# Patient Record
Sex: Male | Born: 1955 | Race: White | Hispanic: No | Marital: Married | Smoking: Current every day smoker
Health system: Southern US, Community
[De-identification: ages and names within clinical notes are randomized; demographics above are authoritative.]

## PROBLEM LIST (undated history)

## (undated) DIAGNOSIS — I10 Essential (primary) hypertension: Secondary | ICD-10-CM

## (undated) DIAGNOSIS — E119 Type 2 diabetes mellitus without complications: Secondary | ICD-10-CM

---

## 2016-06-09 ENCOUNTER — Encounter: Payer: Self-pay | Admitting: Emergency Medicine

## 2016-06-09 ENCOUNTER — Inpatient Hospital Stay
Admission: EM | Admit: 2016-06-09 | Discharge: 2016-06-11 | DRG: 247 | Disposition: A | Discharge status: Home / Self Care | Payer: BLUE CROSS/BLUE SHIELD | Attending: Internal Medicine | Admitting: Internal Medicine

## 2016-06-09 ENCOUNTER — Emergency Department: Payer: BLUE CROSS/BLUE SHIELD

## 2016-06-09 DIAGNOSIS — R079 Chest pain, unspecified: Secondary | ICD-10-CM | POA: Diagnosis not present

## 2016-06-09 DIAGNOSIS — Z7984 Long term (current) use of oral hypoglycemic drugs: Secondary | ICD-10-CM

## 2016-06-09 DIAGNOSIS — Z79899 Other long term (current) drug therapy: Secondary | ICD-10-CM | POA: Diagnosis not present

## 2016-06-09 DIAGNOSIS — J449 Chronic obstructive pulmonary disease, unspecified: Secondary | ICD-10-CM | POA: Diagnosis present

## 2016-06-09 DIAGNOSIS — E669 Obesity, unspecified: Secondary | ICD-10-CM | POA: Diagnosis present

## 2016-06-09 DIAGNOSIS — E119 Type 2 diabetes mellitus without complications: Secondary | ICD-10-CM | POA: Diagnosis present

## 2016-06-09 DIAGNOSIS — I10 Essential (primary) hypertension: Secondary | ICD-10-CM | POA: Diagnosis present

## 2016-06-09 DIAGNOSIS — I255 Ischemic cardiomyopathy: Secondary | ICD-10-CM | POA: Diagnosis present

## 2016-06-09 DIAGNOSIS — G4733 Obstructive sleep apnea (adult) (pediatric): Secondary | ICD-10-CM

## 2016-06-09 DIAGNOSIS — Z6831 Body mass index (BMI) 31.0-31.9, adult: Secondary | ICD-10-CM

## 2016-06-09 DIAGNOSIS — N179 Acute kidney failure, unspecified: Secondary | ICD-10-CM | POA: Diagnosis present

## 2016-06-09 DIAGNOSIS — F17200 Nicotine dependence, unspecified, uncomplicated: Secondary | ICD-10-CM | POA: Diagnosis present

## 2016-06-09 DIAGNOSIS — I2510 Atherosclerotic heart disease of native coronary artery without angina pectoris: Secondary | ICD-10-CM | POA: Diagnosis present

## 2016-06-09 DIAGNOSIS — I214 Non-ST elevation (NSTEMI) myocardial infarction: Principal | ICD-10-CM | POA: Diagnosis present

## 2016-06-09 DIAGNOSIS — E785 Hyperlipidemia, unspecified: Secondary | ICD-10-CM

## 2016-06-09 DIAGNOSIS — N289 Disorder of kidney and ureter, unspecified: Secondary | ICD-10-CM

## 2016-06-09 DIAGNOSIS — I447 Left bundle-branch block, unspecified: Secondary | ICD-10-CM

## 2016-06-09 DIAGNOSIS — E782 Mixed hyperlipidemia: Secondary | ICD-10-CM | POA: Diagnosis present

## 2016-06-09 DIAGNOSIS — Z716 Tobacco abuse counseling: Secondary | ICD-10-CM

## 2016-06-09 HISTORY — DX: Type 2 diabetes mellitus without complications: E11.9

## 2016-06-09 HISTORY — DX: Essential (primary) hypertension: I10

## 2016-06-09 LAB — CBC WITH DIFFERENTIAL/PLATELET
BASOS ABS: 0.1 10*3/uL (ref 0–0.1)
BASOS PCT: 1 %
EOS PCT: 2 %
Eosinophils Absolute: 0.1 10*3/uL (ref 0–0.7)
HEMATOCRIT: 45.8 % (ref 40.0–52.0)
Hemoglobin: 15.9 g/dL (ref 13.0–18.0)
LYMPHS PCT: 21 %
Lymphs Abs: 2.1 10*3/uL (ref 1.0–3.6)
MCH: 28.8 pg (ref 26.0–34.0)
MCHC: 34.7 g/dL (ref 32.0–36.0)
MCV: 83 fL (ref 80.0–100.0)
MONO ABS: 0.7 10*3/uL (ref 0.2–1.0)
Monocytes Relative: 8 %
NEUTROS ABS: 6.6 10*3/uL — AB (ref 1.4–6.5)
Neutrophils Relative %: 68 %
PLATELETS: 245 10*3/uL (ref 150–440)
RBC: 5.52 MIL/uL (ref 4.40–5.90)
RDW: 13.5 % (ref 11.5–14.5)
WBC: 9.6 10*3/uL (ref 3.8–10.6)

## 2016-06-09 LAB — COMPREHENSIVE METABOLIC PANEL
ALBUMIN: 3.9 g/dL (ref 3.5–5.0)
ALT: 18 U/L (ref 17–63)
AST: 24 U/L (ref 15–41)
Alkaline Phosphatase: 54 U/L (ref 38–126)
Anion gap: 9 (ref 5–15)
BUN: 23 mg/dL — AB (ref 6–20)
CHLORIDE: 101 mmol/L (ref 101–111)
CO2: 26 mmol/L (ref 22–32)
Calcium: 9.7 mg/dL (ref 8.9–10.3)
Creatinine, Ser: 1.26 mg/dL — ABNORMAL HIGH (ref 0.61–1.24)
GFR calc Af Amer: >60 mL/min (ref >60)
GLUCOSE: 159 mg/dL — AB (ref 65–99)
POTASSIUM: 4.2 mmol/L (ref 3.5–5.1)
Sodium: 136 mmol/L (ref 135–145)
Total Bilirubin: 0.5 mg/dL (ref 0.3–1.2)
Total Protein: 7.3 g/dL (ref 6.5–8.1)

## 2016-06-09 LAB — GLUCOSE, CAPILLARY: GLUCOSE-CAPILLARY: 141 mg/dL — AB (ref 65–99)

## 2016-06-09 LAB — TROPONIN I
TROPONIN I: 0.22 ng/mL — AB (ref <0.03)
TROPONIN I: 0.42 ng/mL — AB (ref <0.03)
TROPONIN I: 1.46 ng/mL — AB (ref <0.03)
Troponin I: 0.35 ng/mL (ref <0.03)

## 2016-06-09 LAB — APTT: APTT: 73 s — AB (ref 24–36)

## 2016-06-09 LAB — PROTIME-INR
INR: 1.06
Prothrombin Time: 13.8 seconds (ref 11.4–15.2)

## 2016-06-09 MED ORDER — GLYBURIDE 2.5 MG PO TABS
2.5000 mg | ORAL_TABLET | Freq: Every day | ORAL | Status: DC
  Filled 2016-06-09: qty 1

## 2016-06-09 MED ORDER — METFORMIN HCL 500 MG PO TABS: 1000.0000 mg | ORAL_TABLET | Freq: Every day | ORAL | Status: DC

## 2016-06-09 MED ORDER — HEPARIN BOLUS VIA INFUSION
4000.0000 [IU] | Freq: Once | INTRAVENOUS | Status: AC
  Administered 2016-06-09: 4000 [IU] via INTRAVENOUS
  Filled 2016-06-09: qty 4000

## 2016-06-09 MED ORDER — HYDROCODONE-ACETAMINOPHEN 5-325 MG PO TABS
1.0000 | ORAL_TABLET | Freq: Once | ORAL | Status: AC
  Administered 2016-06-09: 1 via ORAL
  Filled 2016-06-09: qty 1

## 2016-06-09 MED ORDER — SODIUM CHLORIDE 0.9% FLUSH
3.0000 mL | Freq: Two times a day (BID) | INTRAVENOUS | Status: DC
  Administered 2016-06-10: 3 mL via INTRAVENOUS

## 2016-06-09 MED ORDER — FENOFIBRATE 160 MG PO TABS: 160.0000 mg | ORAL_TABLET | Freq: Every day | ORAL | Status: DC

## 2016-06-09 MED ORDER — IPRATROPIUM BROMIDE 0.02 % IN SOLN: 0.5000 mg | Freq: Four times a day (QID) | RESPIRATORY_TRACT | Status: DC | PRN

## 2016-06-09 MED ORDER — NITROGLYCERIN 2 % TD OINT
0.5000 [in_us] | TOPICAL_OINTMENT | Freq: Four times a day (QID) | TRANSDERMAL | Status: DC
  Administered 2016-06-10 (×2): 0.5 [in_us] via TOPICAL
  Filled 2016-06-09 (×3): qty 1

## 2016-06-09 MED ORDER — METOPROLOL TARTRATE 25 MG PO TABS
25.0000 mg | ORAL_TABLET | Freq: Four times a day (QID) | ORAL | Status: DC
  Administered 2016-06-09 – 2016-06-10 (×2): 25 mg via ORAL
  Filled 2016-06-09 (×2): qty 1

## 2016-06-09 MED ORDER — SODIUM CHLORIDE 0.9% FLUSH
3.0000 mL | Freq: Two times a day (BID) | INTRAVENOUS | Status: DC
  Administered 2016-06-10 – 2016-06-11 (×2): 3 mL via INTRAVENOUS

## 2016-06-09 MED ORDER — NITROGLYCERIN 2 % TD OINT
1.0000 [in_us] | TOPICAL_OINTMENT | Freq: Once | TRANSDERMAL | Status: AC
  Administered 2016-06-09: 1 [in_us] via TOPICAL
  Filled 2016-06-09: qty 1

## 2016-06-09 MED ORDER — ALBUTEROL SULFATE (2.5 MG/3ML) 0.083% IN NEBU: 2.5000 mg | INHALATION_SOLUTION | Freq: Four times a day (QID) | RESPIRATORY_TRACT | Status: DC | PRN

## 2016-06-09 MED ORDER — ASPIRIN EC 81 MG PO TBEC
81.0000 mg | DELAYED_RELEASE_TABLET | Freq: Every day | ORAL | Status: DC
  Administered 2016-06-10: 81 mg via ORAL
  Filled 2016-06-09: qty 1

## 2016-06-09 MED ORDER — MORPHINE SULFATE (PF) 2 MG/ML IV SOLN: 2.0000 mg | INTRAVENOUS | Status: DC | PRN

## 2016-06-09 MED ORDER — NICOTINE 21 MG/24HR TD PT24
21.0000 mg | MEDICATED_PATCH | Freq: Every day | TRANSDERMAL | Status: DC
  Administered 2016-06-10 – 2016-06-11 (×2): 21 mg via TRANSDERMAL
  Filled 2016-06-09 (×2): qty 1

## 2016-06-09 MED ORDER — INSULIN ASPART 100 UNIT/ML ~~LOC~~ SOLN
6.0000 [IU] | Freq: Three times a day (TID) | SUBCUTANEOUS | Status: DC
  Administered 2016-06-10 – 2016-06-11 (×5): 6 [IU] via SUBCUTANEOUS
  Filled 2016-06-09 (×5): qty 6

## 2016-06-09 MED ORDER — ACETAMINOPHEN 325 MG PO TABS: 650.0000 mg | ORAL_TABLET | Freq: Four times a day (QID) | ORAL | Status: DC | PRN

## 2016-06-09 MED ORDER — TIOTROPIUM BROMIDE MONOHYDRATE 18 MCG IN CAPS
18.0000 ug | ORAL_CAPSULE | Freq: Every day | RESPIRATORY_TRACT | Status: DC
  Administered 2016-06-10 – 2016-06-11 (×2): 18 ug via RESPIRATORY_TRACT
  Filled 2016-06-09: qty 5

## 2016-06-09 MED ORDER — ONDANSETRON HCL 4 MG/2ML IJ SOLN: 4.0000 mg | Freq: Four times a day (QID) | INTRAMUSCULAR | Status: DC | PRN

## 2016-06-09 MED ORDER — HEPARIN (PORCINE) IN NACL 100-0.45 UNIT/ML-% IJ SOLN
1400.0000 [IU]/h | INTRAMUSCULAR | Status: DC
  Administered 2016-06-09: 1200 [IU]/h via INTRAVENOUS
  Filled 2016-06-09 (×2): qty 250

## 2016-06-09 MED ORDER — ONDANSETRON HCL 4 MG PO TABS: 4.0000 mg | ORAL_TABLET | Freq: Four times a day (QID) | ORAL | Status: DC | PRN

## 2016-06-09 MED ORDER — SODIUM CHLORIDE 0.9% FLUSH: 3.0000 mL | INTRAVENOUS | Status: DC | PRN

## 2016-06-09 MED ORDER — IPRATROPIUM BROMIDE 0.02 % IN SOLN
0.5000 mg | RESPIRATORY_TRACT | Status: DC
  Administered 2016-06-09: 0.5 mg via RESPIRATORY_TRACT
  Filled 2016-06-09: qty 2.5

## 2016-06-09 MED ORDER — INSULIN ASPART 100 UNIT/ML ~~LOC~~ SOLN
0.0000 [IU] | Freq: Three times a day (TID) | SUBCUTANEOUS | Status: DC
  Administered 2016-06-10: 4 [IU] via SUBCUTANEOUS
  Filled 2016-06-09: qty 4

## 2016-06-09 MED ORDER — ACETAMINOPHEN 650 MG RE SUPP: 650.0000 mg | Freq: Four times a day (QID) | RECTAL | Status: DC | PRN

## 2016-06-09 MED ORDER — LISINOPRIL 20 MG PO TABS
40.0000 mg | ORAL_TABLET | Freq: Every day | ORAL | Status: DC
  Administered 2016-06-10 – 2016-06-11 (×2): 40 mg via ORAL
  Filled 2016-06-09 (×2): qty 2

## 2016-06-09 MED ORDER — GUAIFENESIN ER 600 MG PO TB12
600.0000 mg | ORAL_TABLET | Freq: Two times a day (BID) | ORAL | Status: DC
  Filled 2016-06-09: qty 1

## 2016-06-09 MED ORDER — HYDROCODONE-ACETAMINOPHEN 5-325 MG PO TABS: 1.0000 | ORAL_TABLET | ORAL | Status: DC | PRN

## 2016-06-09 MED ORDER — SODIUM CHLORIDE 0.9 % IV SOLN: 250.0000 mL | INTRAVENOUS | Status: DC | PRN

## 2016-06-09 MED ORDER — ALBUTEROL SULFATE (2.5 MG/3ML) 0.083% IN NEBU
2.5000 mg | INHALATION_SOLUTION | Freq: Four times a day (QID) | RESPIRATORY_TRACT | Status: DC
  Administered 2016-06-09: 2.5 mg via RESPIRATORY_TRACT
  Filled 2016-06-09: qty 3

## 2016-06-09 MED ORDER — DOCUSATE SODIUM 100 MG PO CAPS
100.0000 mg | ORAL_CAPSULE | Freq: Two times a day (BID) | ORAL | Status: DC
  Administered 2016-06-11: 100 mg via ORAL
  Filled 2016-06-09: qty 1

## 2016-06-09 NOTE — ED Triage Notes (Signed)
Arrived via EMS, a&o.  Onset of chest pain 3 hours ago.  Given nitro x1 and  Asa x4.  Rate pain 3/10 now. Previously 6/10. Skin w/d with good color.

## 2016-06-09 NOTE — ED Notes (Signed)
X-ray at bedside

## 2016-06-09 NOTE — ED Provider Notes (Addendum)
Time Seen: Approximately 1541  I have reviewed the triage notes  Chief Complaint: Chest Pain   History of Present Illness: Alec Baker is a 61 y.o. male who had a silent no significant history of known cardiovascular disease. 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 He noticed some substernal chest discomfort which radiates to the middle of the back that started around noon. He states his symptoms continued to 2 PM. He had some associated diaphoresis with no nausea or vomiting or shortness of breath. EMS was notified and the patient received nitroglycerin and aspirin he has not really noticed any significant improvement other than development of a headache with the nitroglycerin. He denies any arm or jaw pain. He states he went and saw his primary physician last week and was told that he had an electrical abnormality on his EKG and she wanted to set him up with a cardiologist.   Past Medical History:  Diagnosis Date  . Diabetes mellitus without complication (HCC)   . Hypertension     There are no active problems to display for this patient.   No past surgical history on file.  No past surgical history on file.    Allergies:  Patient has no known allergies. No known drug allergies Family History: No family history on file. Patient denies any early cardiovascular disease or cerebral vascular accident and his family history Social History: Social History  Substance Use Topics  . Smoking status: Current Every Day Smoker  . Smokeless tobacco: Never Used  . Alcohol use Not on file     Review of Systems:   10 point review of systems was performed and was otherwise negative:  Constitutional: No fever Eyes: No visual disturbances ENT: No sore throat, ear pain Cardiac: Patient describes current 3 out of 10 substernal chest pressure. He states it radiates to the middle of the back. And he states his back discomfort now is somewhat worse  than the chest discomfort. It became pleuritic or positional component Respiratory: No shortness of breath, wheezing, or stridor Abdomen: No abdominal pain, no vomiting, No diarrhea Endocrine: No weight loss, No night sweats Extremities: No peripheral edema, cyanosis Skin: No rashes, easy bruising Neurologic: No focal weakness, trouble with speech or swollowing Urologic: No dysuria, Hematuria, or urinary frequency Outside of smoking and driving his truck he denies any pulmonary emboli risk factors  Physical Exam:  ED Triage Vitals  Enc Vitals Group     BP 06/09/16 1525 101/70     Pulse Rate 06/09/16 1525 74     Resp 06/09/16 1525 18     Temp 06/09/16 1525 97.9 F (36.6 C)     Temp Source 06/09/16 1525 Oral     SpO2 06/09/16 1525 93 %     Weight 06/09/16 1527 210 lb (95.3 kg)     Height 06/09/16 1527 5\' 8"  (1.727 m)     Head Circumference --      Peak Flow --      Pain Score 06/09/16 1528 3     Pain Loc --      Pain Edu? --      Excl. in GC? --     General: Awake , Alert , and Oriented times 3; GCS 15 Head: Normal cephalic , atraumatic Eyes: Pupils equal , round, reactive to light Nose/Throat: No nasal drainage, patent upper airway without erythema or exudate.  Neck: Supple, Full range of motion, No anterior adenopathy or palpable  thyroid masses Lungs: Clear to ascultation without wheezes , rhonchi, or rales Heart: Regular rate, regular rhythm without murmurs , gallops , or rubs Abdomen: Soft, non tender without rebound, guarding , or rigidity; bowel sounds positive and symmetric in all 4 quadrants. No organomegaly .        Extremities: 2 plus symmetric pulses. No edema, clubbing or cyanosis Neurologic: normal ambulation, Motor symmetric without deficits, sensory intact Skin: warm, dry, no rashes   Labs:   All laboratory work was reviewed including any pertinent negatives or positives listed below:  Labs Reviewed  CBC WITH DIFFERENTIAL/PLATELET - Abnormal; Notable for  the following:       Result Value   Neutro Abs 6.6 (*)    All other components within normal limits  COMPREHENSIVE METABOLIC PANEL  TROPONIN I    EKG:  ED ECG REPORT I, Jennye MoccasinBrian S Lanecia Sliva, the attending physician, personally viewed and interpreted this ECG.  Date: 06/09/2016 EKG Time: 1523 Rate: 78 Rhythm: normal sinus rhythm QRS Axis: normal Intervals: Left bundle branch block ST/T Wave abnormalities: normal Conduction Disturbances: none Narrative Interpretation: unremarkable No past EKGs for comparison No associated ischemic changes with a left bundle branch block  Radiology:   "Dg Chest 2 View  Result Date: 06/09/2016 CLINICAL DATA:  Onset of chest pain 3 hours prior to admission to the emergency department. Pain is decreased following nitroglycerin. EXAM: CHEST  2 VIEW COMPARISON:  None in PACs FINDINGS: The lungs are adequately inflated. There is no focal infiltrate. The heart and pulmonary vascularity are normal. The mediastinum is normal in width. There is calcification in the wall of the aortic arch. The bony thorax is unremarkable. IMPRESSION: There is no acute cardiopulmonary abnormality. Electronically Signed   By: David  SwazilandJordan M.D.   On: 06/09/2016 16:32  "   I personally reviewed the radiologic studies   ED Course:  Differential includes all life-threatening causes for chest pain. This includes but is not exclusive to acute coronary syndrome, aortic dissection, pulmonary embolism, cardiac tamponade, community-acquired pneumonia, pericarditis, musculoskeletal chest wall pain, etc. The patient's troponin comes back at 0.22 and he has a left bundle branch block pattern on his EKG but does not have any Sgarbossa criteria. We are attempting to acquire the EKG that was performed as primary physician's office for comparison. Patient's are received aspirin and nitroglycerin therapy and is having symptomatic improvement. Patient will require hospitalization, cardiac  consultation, and possible invasive studies while here in the hospital.  Assessment:  Acute non-STEMI myocardial infarction   Final Clinical Impression:   Final diagnoses:  Chest pain     Plan:  Inpatient            Jennye MoccasinBrian S Calais Svehla, MD 06/09/16 1645    Jennye MoccasinBrian S Kasey Ewings, MD 06/09/16 [phone]

## 2016-06-09 NOTE — H&P (Addendum)
[facility] - [place] [facility]   PATIENT NAME: Alec HeinzRex Suliman    MR#:  [id number]  DATE OF BIRTH:  07-26-1955  DATE OF ADMISSION:  06/09/2016  PRIMARY CARE PHYSICIAN: No PCP Per Patient   REQUESTING/REFERRING PHYSICIAN:   CHIEF COMPLAINT:   Chief Complaint  Patient presents with  . Chest Pain    HISTORY OF PRESENT ILLNESS: Alec Baker  is a 61 y.o. male with a known history of Hypertension, hyperlipidemia, diabetes, who is troubled driver, who presents to the hospital with complaints of severe midsternal chest pain. Going to the patient, he was sitting in the truck and waiting to be unloaded, when he suddenly started having midsternal chest pain. Patient's pain was described as sudden onset, and other than by intensity, sharp "like a hammer hitting the chest" pain. There is no alleviating or aggravating factors. No radiation. There was no lightheadedness, dizziness or shortness of breath, patient has some nausea, but not vomiting, also chills. He presented to emergency room for further evaluation by EMS and was noted to have mild elevation of troponin. Hospitalist services were contacted for admission  PAST MEDICAL HISTORY:   Past Medical History:  Diagnosis Date  . Diabetes mellitus without complication (HCC)   . Hypertension     PAST SURGICAL HISTORY: No past surgical history on file.  SOCIAL HISTORY:  Social History  Substance Use Topics  . Smoking status: Current Every Day Smoker  . Smokeless tobacco: Never Used  . Alcohol use Not on file    FAMILY HISTORY: No family history on file.  DRUG ALLERGIES: No Known Allergies  Review of Systems  Constitutional: Positive for chills and malaise/fatigue.  Cardiovascular: Positive for chest pain.    MEDICATIONS AT HOME:  Prior to Admission medications   Medication Sig Start Date End Date Taking? Authorizing Provider  fenofibrate (TRICOR) 145 MG tablet Take 1 tablet by mouth daily. 06/04/16  Yes Historical  Provider, MD  glyBURIDE (DIABETA) 2.5 MG tablet Take 1 tablet by mouth daily. 06/04/16  Yes Historical Provider, MD  hydrochlorothiazide (HYDRODIURIL) 25 MG tablet Take 1 tablet by mouth daily. 06/04/16  Yes Historical Provider, MD  ibuprofen (ADVIL,MOTRIN) 800 MG tablet Take 1 tablet by mouth 4 (four) times daily as needed. 04/02/16  Yes Historical Provider, MD  lisinopril (PRINIVIL,ZESTRIL) 40 MG tablet Take 40 mg by mouth daily. 06/03/16  Yes Historical Provider, MD  metFORMIN (GLUCOPHAGE) 1000 MG tablet Take 1 tablet by mouth daily. 06/04/16  Yes Historical Provider, MD      PHYSICAL EXAMINATION:   VITAL SIGNS: Blood pressure 114/66, pulse 72, temperature 97.9 F (36.6 C), temperature source Oral, resp. rate 10, height 5\' 8"  (1.727 m), weight 95.3 kg (210 lb), SpO2 98 %.  GENERAL:  61 y.o.-year-old patient lying in the bed with no acute distress. Smiling and comfortable EYES: Pupils equal, round, reactive to light and accommodation. No scleral icterus. Extraocular muscles intact.  HEENT: Head atraumatic, normocephalic. Oropharynx and nasopharynx clear.  NECK:  Supple, no jugular venous distention. No thyroid enlargement, no tenderness.  LUNGS: Markedly diminished breath sounds bilaterally, anteriorly and posteriorly, scattered wheezing, ,rhonchi , but no rales or crepitations . Intermittent use of accessory muscles of respiration.  CARDIOVASCULAR: S1, S2 normal. No murmurs, rubs, or gallops.  ABDOMEN: Soft, nontender, nondistended. Bowel sounds present. No organomegaly or mass.  EXTREMITIES: No pedal edema, cyanosis, or clubbing. No calf tenderness bilaterally NEUROLOGIC: Cranial nerves II through XII are intact. Muscle strength 5/5 in all extremities.  Sensation intact. Gait not checked.  PSYCHIATRIC: The patient is alert and oriented x 3.  SKIN: No obvious rash, lesion, or ulcer.   LABORATORY PANEL:   CBC  Recent Labs Lab 06/09/16 1533  WBC 9.6  HGB 15.9  HCT 45.8  PLT 245  MCV  83.0  MCH 28.8  MCHC 34.7  RDW 13.5  LYMPHSABS 2.1  MONOABS 0.7  EOSABS 0.1  BASOSABS 0.1   ------------------------------------------------------------------------------------------------------------------  Chemistries   Recent Labs Lab 06/09/16 1533  NA 136  K 4.2  CL 101  CO2 26  GLUCOSE 159*  BUN 23*  CREATININE 1.26*  CALCIUM 9.7  AST 24  ALT 18  ALKPHOS 54  BILITOT 0.5   ------------------------------------------------------------------------------------------------------------------  Cardiac Enzymes  Recent Labs Lab 06/09/16 1533  TROPONINI 0.22*   ------------------------------------------------------------------------------------------------------------------  RADIOLOGY: Dg Chest 2 View  Result Date: 06/09/2016 CLINICAL DATA:  Onset of chest pain 3 hours prior to admission to the emergency department. Pain is decreased following nitroglycerin. EXAM: CHEST  2 VIEW COMPARISON:  None in PACs FINDINGS: The lungs are adequately inflated. There is no focal infiltrate. The heart and pulmonary vascularity are normal. The mediastinum is normal in width. There is calcification in the wall of the aortic arch. The bony thorax is unremarkable. IMPRESSION: There is no acute cardiopulmonary abnormality. Electronically Signed   By: David  [place] M.D.   On: 06/09/2016 16:32    EKG: Orders placed or performed during the hospital encounter of 06/09/16  . ED EKG  . ED EKG  EKG in emergency room revealed sinus rhythm at 78 bpm, borderline prolonged PR interval, intraventricular conduction delay, consider atypical left bundle branch block, possible acute inferior infarct, per EKG criteria, and repeated EKG showed no acute ST-T wave abnormalities apart from nonspecific changes  IMPRESSION AND PLAN:  Active Problems:   Chest pain   Left bundle branch block   Elevated troponin   Acute renal insufficiency   Diabetes mellitus (HCC)   Essential hypertension   Obesity    Obstructive sleep apnea   Acute non Q wave MI (myocardial infarction), initial episode of care (HCC)  #1. Chest pain with elevated troponin, concerning for acute MI, admit patient to medical floor, initiate him on aspirin, metoprolol, nitroglycerin topically, heparin intravenously, follow cardiac enzymes 3, get cardiologist involved for further recommendations #2. Left bundle branch block, likely chronic, awaiting for medical records from [facility] #3. Acute renal insufficiency, discontinue HCTZ, follow labs in the morning #4. Obesity and suspected obstructive sleep apnea, as patient reports stopping breathing at night, patient will need to have outpatient follow-up with primary care physician and set up for a sleep study, discussed with him extensively #5. Tobacco abuse. Counseling, discussed this patient for 4 minutes, nicotine replacement therapy will be initiated, he is agreeable  All the records are reviewed and case discussed with ED provider. Management plans discussed with the patient, family and they are in agreement.  CODE STATUS: Code Status History    This patient does not have a recorded code status. Please follow your organizational policy for patients in this situation.       TOTAL TIME TAKING CARE OF THIS PATIENT: 60 minutes.    Katharina Caper M.D on 06/09/2016 at 5:22 PM  Between 7am to 6pm - Pager - [phone] After 6pm go to [email or web address] - password [email or web address] [facility]  [facility]  Office  [phone]  CC: Primary care physician; No PCP Per Patient

## 2016-06-09 NOTE — Progress Notes (Signed)
ANTICOAGULATION CONSULT NOTE - Initial Consult  Pharmacy Consult for Heparin Indication: chest pain/ACS  No Known Allergies  Patient Measurements: Height: 5\' 8"  (172.7 cm) Weight: 210 lb (95.3 kg) IBW/kg (Calculated) : 68.4 Heparin Dosing Weight: 88.4 kg  Vital Signs: Temp: 97.9 F (36.6 C) (01/22 1525) Temp Source: Oral (01/22 1525) BP: 114/66 (01/22 1630) Pulse Rate: 72 (01/22 1630)  Labs:  Recent Labs  06/09/16 1533  HGB 15.9  HCT 45.8  PLT 245  CREATININE 1.26*  TROPONINI 0.22*    Estimated Creatinine Clearance: 70.7 mL/min (by C-G formula based on SCr of 1.26 mg/dL (H)).   Medical History: Past Medical History:  Diagnosis Date  . Diabetes mellitus without complication (HCC)   . Hypertension     Medications:   (Not in a hospital admission)  Assessment: 61 y/o M with a h/o DM and HTN admitted with CP.   Goal of Therapy:  Heparin level 0.3-0.7 units/ml Monitor platelets by anticoagulation protocol: Yes   Plan:  Give 4000 units bolus x 1 Start heparin infusion at 1200 units/hr Check anti-Xa level in 6 hours and daily while on heparin Continue to monitor H&H and platelets  Luisa Harthristy, Kerisha Goughnour D 06/09/2016,5:32 PM

## 2016-06-09 NOTE — ED Notes (Signed)
Report called to floor nurse shakira rn

## 2016-06-10 ENCOUNTER — Encounter
Admission: EM | Disposition: A | Discharge status: Home / Self Care | Payer: Self-pay | Source: Home / Self Care | Attending: Internal Medicine

## 2016-06-10 HISTORY — PX: CARDIAC CATHETERIZATION: SHX172

## 2016-06-10 LAB — BASIC METABOLIC PANEL
ANION GAP: 7 (ref 5–15)
BUN: 29 mg/dL — AB (ref 6–20)
CALCIUM: 9.1 mg/dL (ref 8.9–10.3)
CO2: 27 mmol/L (ref 22–32)
Chloride: 99 mmol/L — ABNORMAL LOW (ref 101–111)
Creatinine, Ser: 1.36 mg/dL — ABNORMAL HIGH (ref 0.61–1.24)
GFR calc Af Amer: >60 mL/min (ref >60)
GFR, EST NON AFRICAN AMERICAN: 55 mL/min — AB (ref >60)
GLUCOSE: 257 mg/dL — AB (ref 65–99)
Potassium: 4.1 mmol/L (ref 3.5–5.1)
Sodium: 133 mmol/L — ABNORMAL LOW (ref 135–145)

## 2016-06-10 LAB — CBC
HEMATOCRIT: 42.5 % (ref 40.0–52.0)
Hemoglobin: 14.5 g/dL (ref 13.0–18.0)
MCH: 28.8 pg (ref 26.0–34.0)
MCHC: 34.1 g/dL (ref 32.0–36.0)
MCV: 84.6 fL (ref 80.0–100.0)
Platelets: 210 10*3/uL (ref 150–440)
RBC: 5.02 MIL/uL (ref 4.40–5.90)
RDW: 13.5 % (ref 11.5–14.5)
WBC: 11.4 10*3/uL — ABNORMAL HIGH (ref 3.8–10.6)

## 2016-06-10 LAB — GLUCOSE, CAPILLARY
GLUCOSE-CAPILLARY: 95 mg/dL (ref 65–99)
GLUCOSE-CAPILLARY: 130 mg/dL — AB (ref 65–99)
Glucose-Capillary: 170 mg/dL — ABNORMAL HIGH (ref 65–99)
Glucose-Capillary: 109 mg/dL — ABNORMAL HIGH (ref 65–99)

## 2016-06-10 LAB — TROPONIN I
Troponin I: 3.27 ng/mL (ref <0.03)
Troponin I: 13.14 ng/mL (ref <0.03)
Troponin I: >65 ng/mL (ref <0.03)

## 2016-06-10 LAB — HEPARIN LEVEL (UNFRACTIONATED)
HEPARIN UNFRACTIONATED: 0.27 [IU]/mL — AB (ref 0.30–0.70)
Heparin Unfractionated: 0.55 IU/mL (ref 0.30–0.70)

## 2016-06-10 SURGERY — LEFT HEART CATH
Anesthesia: Moderate Sedation

## 2016-06-10 MED ORDER — NITROGLYCERIN 1 MG/10 ML FOR IR/CATH LAB
INTRA_ARTERIAL | Status: DC | PRN
  Administered 2016-06-10: 200 ug

## 2016-06-10 MED ORDER — ASPIRIN 81 MG PO CHEW
CHEWABLE_TABLET | ORAL | Status: AC
  Filled 2016-06-10: qty 3

## 2016-06-10 MED ORDER — HYDRALAZINE HCL 20 MG/ML IJ SOLN: 5.0000 mg | INTRAMUSCULAR | Status: AC | PRN

## 2016-06-10 MED ORDER — SODIUM CHLORIDE 0.9% FLUSH: 3.0000 mL | INTRAVENOUS | Status: DC | PRN

## 2016-06-10 MED ORDER — ATORVASTATIN CALCIUM 20 MG PO TABS
40.0000 mg | ORAL_TABLET | Freq: Every day | ORAL | Status: DC
  Administered 2016-06-10: 40 mg via ORAL
  Filled 2016-06-10: qty 2
  Filled 2016-06-10: qty 1

## 2016-06-10 MED ORDER — ACETAMINOPHEN 325 MG PO TABS: 650.0000 mg | ORAL_TABLET | ORAL | Status: DC | PRN

## 2016-06-10 MED ORDER — ASPIRIN 81 MG PO CHEW
81.0000 mg | CHEWABLE_TABLET | Freq: Every day | ORAL | Status: DC
  Administered 2016-06-11: 81 mg via ORAL
  Filled 2016-06-10: qty 1

## 2016-06-10 MED ORDER — TICAGRELOR 90 MG PO TABS
ORAL_TABLET | ORAL | Status: DC | PRN
  Administered 2016-06-10: 180 mg via ORAL

## 2016-06-10 MED ORDER — HEPARIN BOLUS VIA INFUSION
1300.0000 [IU] | Freq: Once | INTRAVENOUS | Status: AC
  Administered 2016-06-10: 1300 [IU] via INTRAVENOUS
  Filled 2016-06-10: qty 1300

## 2016-06-10 MED ORDER — TICAGRELOR 90 MG PO TABS
90.0000 mg | ORAL_TABLET | Freq: Two times a day (BID) | ORAL | Status: DC
  Administered 2016-06-10 – 2016-06-11 (×2): 90 mg via ORAL
  Filled 2016-06-10 (×2): qty 1

## 2016-06-10 MED ORDER — GLYBURIDE 2.5 MG PO TABS: 2.5000 mg | ORAL_TABLET | Freq: Every day | ORAL | Status: DC

## 2016-06-10 MED ORDER — HEPARIN (PORCINE) IN NACL 2-0.9 UNIT/ML-% IJ SOLN
INTRAMUSCULAR | Status: AC
  Filled 2016-06-10: qty 500

## 2016-06-10 MED ORDER — SODIUM CHLORIDE 0.9 % WEIGHT BASED INFUSION: 1.0000 mL/kg/h | INTRAVENOUS | Status: AC

## 2016-06-10 MED ORDER — FENTANYL CITRATE (PF) 100 MCG/2ML IJ SOLN
INTRAMUSCULAR | Status: AC
  Filled 2016-06-10: qty 2

## 2016-06-10 MED ORDER — BIVALIRUDIN BOLUS VIA INFUSION - CUPID
INTRAVENOUS | Status: DC | PRN
  Administered 2016-06-10: 68.4 mg via INTRAVENOUS

## 2016-06-10 MED ORDER — ASPIRIN 81 MG PO CHEW
CHEWABLE_TABLET | ORAL | Status: DC | PRN
  Administered 2016-06-10: 263 mg via ORAL

## 2016-06-10 MED ORDER — IOPAMIDOL (ISOVUE-300) INJECTION 61%
INTRAVENOUS | Status: DC | PRN
  Administered 2016-06-10: 140 mL via INTRA_ARTERIAL

## 2016-06-10 MED ORDER — SODIUM CHLORIDE 0.9% FLUSH
3.0000 mL | Freq: Two times a day (BID) | INTRAVENOUS | Status: DC
  Administered 2016-06-10 – 2016-06-11 (×2): 3 mL via INTRAVENOUS

## 2016-06-10 MED ORDER — SODIUM CHLORIDE 0.9 % WEIGHT BASED INFUSION
3.0000 mL/kg/h | INTRAVENOUS | Status: DC
  Administered 2016-06-10: 3 mL/kg/h via INTRAVENOUS

## 2016-06-10 MED ORDER — BIVALIRUDIN 250 MG IV SOLR
0.2500 mg/kg/h | INTRAVENOUS | Status: AC
  Filled 2016-06-10: qty 250

## 2016-06-10 MED ORDER — BIVALIRUDIN 250 MG IV SOLR
INTRAVENOUS | Status: AC
  Filled 2016-06-10: qty 250

## 2016-06-10 MED ORDER — NITROGLYCERIN 5 MG/ML IV SOLN
INTRAVENOUS | Status: AC
  Filled 2016-06-10: qty 10

## 2016-06-10 MED ORDER — FENTANYL CITRATE (PF) 100 MCG/2ML IJ SOLN
INTRAMUSCULAR | Status: DC | PRN
  Administered 2016-06-10: 25 ug via INTRAVENOUS

## 2016-06-10 MED ORDER — TICAGRELOR 90 MG PO TABS
ORAL_TABLET | ORAL | Status: AC
  Filled 2016-06-10: qty 2

## 2016-06-10 MED ORDER — SODIUM CHLORIDE 0.9% FLUSH
3.0000 mL | Freq: Two times a day (BID) | INTRAVENOUS | Status: DC
  Administered 2016-06-10: 3 mL via INTRAVENOUS

## 2016-06-10 MED ORDER — ONDANSETRON HCL 4 MG/2ML IJ SOLN: 4.0000 mg | Freq: Four times a day (QID) | INTRAMUSCULAR | Status: DC | PRN

## 2016-06-10 MED ORDER — METOPROLOL TARTRATE 25 MG PO TABS
25.0000 mg | ORAL_TABLET | Freq: Two times a day (BID) | ORAL | Status: DC
  Administered 2016-06-11: 25 mg via ORAL
  Filled 2016-06-10 (×3): qty 1

## 2016-06-10 MED ORDER — IOPAMIDOL (ISOVUE-300) INJECTION 61%
INTRAVENOUS | Status: DC | PRN
  Administered 2016-06-10: 200 mL via INTRAVENOUS

## 2016-06-10 MED ORDER — MIDAZOLAM HCL 2 MG/2ML IJ SOLN
INTRAMUSCULAR | Status: AC
  Filled 2016-06-10: qty 2

## 2016-06-10 MED ORDER — SODIUM CHLORIDE 0.9 % IV SOLN
INTRAVENOUS | Status: DC | PRN
  Administered 2016-06-10: 14:00:00
  Administered 2016-06-10: 1.75 mg/kg/h via INTRAVENOUS

## 2016-06-10 MED ORDER — SODIUM CHLORIDE 0.9 % WEIGHT BASED INFUSION: 1.0000 mL/kg/h | INTRAVENOUS | Status: DC

## 2016-06-10 MED ORDER — ASPIRIN 81 MG PO CHEW: 81.0000 mg | CHEWABLE_TABLET | ORAL | Status: DC

## 2016-06-10 MED ORDER — LABETALOL HCL 5 MG/ML IV SOLN: 10.0000 mg | INTRAVENOUS | Status: AC | PRN

## 2016-06-10 MED ORDER — MIDAZOLAM HCL 2 MG/2ML IJ SOLN
INTRAMUSCULAR | Status: DC | PRN
  Administered 2016-06-10: 1 mg via INTRAVENOUS

## 2016-06-10 MED ORDER — SODIUM CHLORIDE 0.9 % IV SOLN: 250.0000 mL | INTRAVENOUS | Status: DC | PRN

## 2016-06-10 MED ORDER — SODIUM CHLORIDE 0.9 % IV SOLN
250.0000 mL | INTRAVENOUS | Status: DC | PRN
Start: 2016-06-10 — End: 2016-06-10

## 2016-06-10 SURGICAL SUPPLY — 19 items
BALLN MINITREK RX 2.0X20 (BALLOONS) ×2
BALLN TREK RX 3.0X20 (BALLOONS) ×2
BALLOON MINITREK RX 2.0X20 (BALLOONS) ×1 IMPLANT
BALLOON TREK RX 3.0X20 (BALLOONS) ×1 IMPLANT
CATH 5FR JR4 DIAGNOSTIC (CATHETERS) ×2 IMPLANT
CATH 5FR PIGTAIL DIAGNOSTIC (CATHETERS) ×2 IMPLANT
CATH INFINITI 5FR JL4 (CATHETERS) ×2 IMPLANT
CATH VISTA GUIDE 6FR JR4 (CATHETERS) ×2 IMPLANT
DEVICE CLOSURE MYNXGRIP 6/7F (Vascular Products) ×2 IMPLANT
DEVICE INFLAT 30 PLUS (MISCELLANEOUS) ×2 IMPLANT
KIT MANI 3VAL PERCEP (MISCELLANEOUS) ×2 IMPLANT
NEEDLE PERC 18GX7CM (NEEDLE) ×2 IMPLANT
PACK CARDIAC CATH (CUSTOM PROCEDURE TRAY) ×2 IMPLANT
SHEATH AVANTI 6FR X 11CM (SHEATH) ×2 IMPLANT
SHEATH PINNACLE 5F 10CM (SHEATH) ×2 IMPLANT
STENT XIENCE ALPINE RX 3.5X28 (Permanent Stent) ×2 IMPLANT
WIRE EMERALD 3MM-J .035X150CM (WIRE) ×2 IMPLANT
WIRE G HI TQ BMW 190 (WIRE) ×2 IMPLANT
WIRE HI TORQ WHISPER MS 190CM (WIRE) ×2 IMPLANT

## 2016-06-10 NOTE — Progress Notes (Deleted)
ANTICOAGULATION CONSULT NOTE - Initial Consult  Pharmacy Consult for Heparin Indication: chest pain/ACS  No Known Allergies  Patient Measurements: Height: 5\' 7"  (170.2 cm) Weight: 201 lb 11.2 oz (91.5 kg) IBW/kg (Calculated) : 66.1 Heparin Dosing Weight: 88.4 kg  Vital Signs: BP: 102/65 (01/23 0449) Pulse Rate: 67 (01/23 0449)  Labs:  Recent Labs  06/09/16 1533 06/09/16 1909  06/09/16 2304 06/10/16 0143 06/10/16 0815 06/10/16 1018  HGB 15.9  --   --   --  14.5  --   --   HCT 45.8  --   --   --  42.5  --   --   PLT 245  --   --   --  210  --   --   APTT  --  73*  --   --   --   --   --   LABPROT  --  13.8  --   --   --   --   --   INR  --  1.06  --   --   --   --   --   HEPARINUNFRC  --   --   --   --  0.27*  --  0.55  CREATININE 1.26*  --   --   --  1.36*  --   --   TROPONINI 0.22* 0.35*  < > 1.46* 3.27* 13.14*  --   < > = values in this interval not displayed.  Estimated Creatinine Clearance: 63.1 mL/min (by C-G formula based on SCr of 1.36 mg/dL (H)).   Medical History: Past Medical History:  Diagnosis Date  . Diabetes mellitus without complication (HCC)   . Hypertension     Medications:  Prescriptions Prior to Admission  Medication Sig Dispense Refill Last Dose  . fenofibrate (TRICOR) 145 MG tablet Take 1 tablet by mouth daily.  0 06/09/2016 at 0500  . glyBURIDE (DIABETA) 2.5 MG tablet Take 1 tablet by mouth daily.  0 06/09/2016 at 0500  . hydrochlorothiazide (HYDRODIURIL) 25 MG tablet Take 1 tablet by mouth daily.  0 06/09/2016 at 0500  . ibuprofen (ADVIL,MOTRIN) 800 MG tablet Take 1 tablet by mouth 4 (four) times daily as needed.  3 Past Week at Unknown time  . lisinopril (PRINIVIL,ZESTRIL) 40 MG tablet Take 40 mg by mouth daily.  0 06/09/2016 at 0500  . metFORMIN (GLUCOPHAGE) 1000 MG tablet Take 1 tablet by mouth daily.  0 06/09/2016 at 0500    Assessment: 61 y/o M with a h/o DM and HTN admitted with CP.   Goal of Therapy:  Heparin level 0.3-0.7  units/ml Monitor platelets by anticoagulation protocol: Yes   Plan:  Continue heparin drip at 1400 units/hr and recheck level at 1600 or f/u after cath for anticoagulation plans.   Luisa Harthristy, Icelyn Navarrete D 06/10/2016,10:58 AM

## 2016-06-10 NOTE — Consult Note (Signed)
[facility] Consultation [facility]  Patient ID: Alec HeinzRex Costanzo, MRN: [id number], DOB/AGE: 01/29/1956 61 y.o. Admit date: 06/09/2016   Date of Consult: 06/10/2016 Primary Physician: No PCP Per Patient Primary Cardiologist: None  Chief Complaint:  Chief Complaint  Patient presents with  . Chest Pain   Reason for Consult: chest pain  HPI: [age] y.o. male with known diabetes with complication essential hypertension mixed hyperlipidemia and tobacco use for which the patient has had an apparent abnormal EKG in recent weeks with some shortness of breath with physical activity relieved by rest. This is increased in frequency and intensity and then culminating in severe substernal chest discomfort radiating into his back lasting for several hours when he was seen in the emergency room at that time he had an abnormal EKG. He additionally had elevated troponin now consistent with non-ST elevation myocardial infarction. He was placed on appropriate medication management and now is pain-free and more stable. He has had a previous treatment of diabetes hypertension hyperlipidemia which has been reasonable.  Past Medical History:  Diagnosis Date  . Diabetes mellitus without complication (HCC)   . Hypertension       Surgical History: No past surgical history on file.   Home Meds: Prior to Admission medications   Medication Sig Start Date End Date Taking? Authorizing Provider  fenofibrate (TRICOR) 145 MG tablet Take 1 tablet by mouth daily. 06/04/16  Yes Historical Provider, MD  glyBURIDE (DIABETA) 2.5 MG tablet Take 1 tablet by mouth daily. 06/04/16  Yes Historical Provider, MD  hydrochlorothiazide (HYDRODIURIL) 25 MG tablet Take 1 tablet by mouth daily. 06/04/16  Yes Historical Provider, MD  ibuprofen (ADVIL,MOTRIN) 800 MG tablet Take 1 tablet by mouth 4 (four) times daily as needed. 04/02/16  Yes Historical Provider, MD  lisinopril (PRINIVIL,ZESTRIL) 40 MG tablet Take 40 mg by mouth daily. 06/03/16  Yes  Historical Provider, MD  metFORMIN (GLUCOPHAGE) 1000 MG tablet Take 1 tablet by mouth daily. 06/04/16  Yes Historical Provider, MD    Inpatient Medications:  . aspirin EC  81 mg Oral Daily  . docusate sodium  100 mg Oral BID  . fenofibrate  160 mg Oral Daily  . guaiFENesin  600 mg Oral BID  . insulin aspart  0-20 Units Subcutaneous TID WC  . insulin aspart  6 Units Subcutaneous TID WC  . lisinopril  40 mg Oral Daily  . metFORMIN  1,000 mg Oral Daily  . metoprolol tartrate  25 mg Oral BID  . nicotine  21 mg Transdermal Daily  . nitroGLYCERIN  0.5 inch Topical Q6H  . sodium chloride flush  3 mL Intravenous Q12H  . sodium chloride flush  3 mL Intravenous Q12H  . tiotropium  18 mcg Inhalation Daily   . heparin 1,400 Units/hr (06/10/16 [id number])    Allergies: No Known Allergies  Social History   Social History  . Marital status: Married    Spouse name: N/A  . Number of children: N/A  . Years of education: N/A   Occupational History  . Not on file.   Social History Main Topics  . Smoking status: Current Every Day Smoker  . Smokeless tobacco: Never Used  . Alcohol use Not on file  . Drug use: Unknown  . Sexual activity: Not on file   Other Topics Concern  . Not on file   Social History Narrative  . No narrative on file     No family history on file.   Review of Systems Positive for Chest  pain Negative for: General:  chills, fever, night sweats or weight changes.  Cardiovascular: PND orthopnea syncope dizziness  Dermatological skin lesions rashes Respiratory: Cough congestion Urologic: Frequent urination urination at night and hematuria Abdominal: negative for nausea, vomiting, diarrhea, bright red blood per rectum, melena, or hematemesis Neurologic: negative for visual changes, and/or hearing changes  All other systems reviewed and are otherwise negative except as noted above.  Labs:  Recent Labs  06/09/16 2208 06/09/16 2304 06/10/16 0143 06/10/16 0815   TROPONINI 0.42* 1.46* 3.27* 13.14*   Lab Results  Component Value Date   WBC 11.4 (H) 06/10/2016   HGB 14.5 06/10/2016   HCT 42.5 06/10/2016   MCV 84.6 06/10/2016   PLT 210 06/10/2016    Recent Labs Lab 06/09/16 1533 06/10/16 0143  NA 136 133*  K 4.2 4.1  CL 101 99*  CO2 26 27  BUN 23* 29*  CREATININE 1.26* 1.36*  CALCIUM 9.7 9.1  PROT 7.3  --   BILITOT 0.5  --   ALKPHOS 54  --   ALT 18  --   AST 24  --   GLUCOSE 159* 257*   No results found for: CHOL, HDL, LDLCALC, TRIG No results found for: DDIMER  Radiology/Studies:  Dg Chest 2 View  Result Date: 06/09/2016 CLINICAL DATA:  Onset of chest pain 3 hours prior to admission to the emergency department. Pain is decreased following nitroglycerin. EXAM: CHEST  2 VIEW COMPARISON:  None in PACs FINDINGS: The lungs are adequately inflated. There is no focal infiltrate. The heart and pulmonary vascularity are normal. The mediastinum is normal in width. There is calcification in the wall of the aortic arch. The bony thorax is unremarkable. IMPRESSION: There is no acute cardiopulmonary abnormality. Electronically Signed   By: David  [place] M.D.   On: 06/09/2016 16:32    EKG: Normal sinus rhythm with nonspecific ST and T-wave changes  Weights: Filed Weights   06/09/16 1527 06/09/16 2252 06/10/16 0900  Weight: 95.3 kg (210 lb) 90.2 kg (198 lb 14.4 oz) 91.5 kg (201 lb 11.2 oz)     Physical Exam: Blood pressure 102/65, pulse 67, temperature 97.8 F (36.6 C), resp. rate 18, height 5\' 7"  (1.702 m), weight 91.5 kg (201 lb 11.2 oz), SpO2 93 %. Body mass index is 31.59 kg/m. General: Well developed, well nourished, in no acute distress. Head eyes ears nose throat: Normocephalic, atraumatic, sclera non-icteric, no xanthomas, nares are without discharge. No apparent thyromegaly and/or mass  Lungs: Normal respiratory effort.  no wheezes, no rales, no rhonchi.  Heart: RRR with normal S1 S2. no murmur gallop, no rub, PMI is normal size  and placement, carotid upstroke normal without bruit, jugular venous pressure is normal Abdomen: Soft, non-tender, non-distended with normoactive bowel sounds. No hepatomegaly. No rebound/guarding. No obvious abdominal masses. Abdominal aorta is normal size without bruit Extremities: No edema. no cyanosis, no clubbing, no ulcers  Peripheral : 2+ bilateral upper extremity pulses, 2+ bilateral femoral pulses, 2+ bilateral dorsal pedal pulse Neuro: Alert and oriented. No facial asymmetry. No focal deficit. Moves all extremities spontaneously. Musculoskeletal: Normal muscle tone without kyphosis Psych:  Responds to questions appropriately with a normal affect.    Assessment: 61 year old male with diabetes with complication essential hypertension mixed hyperlipidemia with acute non-ST elevation myocardial infarction  Plan: 1. Continue medication management for further risk reduction of myocardial infarction including metoprolol and ACE inhibitor and nitrates 2. Aspirin and heparin for further risk reduction of myocardial infarction 3. Proceed to cardiac  catheterization to assess coronary anatomy and further treatment thereof is necessary. Patient understands risk and benefits of cardiac catheterization. This includes a possibility of death stroke heart attack infection bleeding or blood clot. He is at low risk for conscious sedation  Signed, Lamar Blinks M.D. [facility] Card[facility] 06/10/2016, 9:55 AM

## 2016-06-10 NOTE — Progress Notes (Signed)
[facility]  Patient: Alec Baker / Admit Date: 06/09/2016 / Date of Encounter: 06/10/2016, 1:04 PM   Subjective: No further chest pain. Tolerating medications well. 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 rate 20, height 5\' 7"  (1.702 m), weight 91.2 kg (201 lb), SpO2 95 %. Body mass index is 31.48 kg/m. General: Well developed, well nourished, in no acute distress. Head: Normocephalic, atraumatic, sclera non-icteric, no xanthomas, nares are without discharge. Neck: No apparent masses Lungs: Normal respirations with no wheezes, no rhonchi, no rales , no crackles   Heart: Regular rate and rhythm, normal S1 S2, no murmur, no rub, no gallop, PMI is normal size and placement, carotid upstroke normal without bruit, jugular venous pressure normal Abdomen: Soft, non-tender, non-distended with normoactive bowel sounds. No hepatosplenomegaly. Abdominal aorta is normal size without bruit Extremities: No edema, no clubbing, no cyanosis, no ulcers,  Peripheral: 2+ radial, 2+ femoral, 2+ dorsal pedal pulses Neuro: Alert and oriented. Moves all extremities spontaneously. Psych:  Responds to questions appropriately with a normal affect.   Intake/Output Summary (Last 24 hours)  at 06/10/16 1304 Last data filed at 06/10/16 0428  Gross per 24 hour  Intake           112.13 ml  Output              400 ml  Net          -287.87 ml    Inpatient Medications:  . [START ON 06/11/2016] aspirin  81 mg Oral Pre-Cath  . [MAR Hold] aspirin EC  81 mg Oral Daily  . [MAR Hold] docusate sodium  100 mg Oral BID  . [MAR Hold] fenofibrate  160 mg Oral Daily  . [MAR Hold] guaiFENesin  600 mg Oral BID  . [MAR Hold] insulin aspart  0-20 Units Subcutaneous TID WC  . [MAR Hold] insulin aspart  6 Units Subcutaneous TID WC  . [MAR Hold] lisinopril  40 mg Oral Daily  . [MAR Hold] metFORMIN  1,000 mg Oral Daily  . [MAR Hold] metoprolol tartrate  25 mg Oral BID  . [MAR Hold] nicotine  21 mg Transdermal Daily  . [MAR Hold] nitroGLYCERIN  0.5 inch Topical Q6H  . [MAR Hold] sodium chloride flush  3 mL Intravenous Q12H  . [MAR Hold] sodium chloride flush  3 mL Intravenous Q12H  . sodium chloride flush  3 mL Intravenous Q12H  . [MAR Hold] tiotropium  18 mcg Inhalation Daily   Infusions:  . [START ON 06/11/2016] sodium chloride 3 mL/kg/hr (06/10/16 1146)   Followed by  . [START ON 06/11/2016] sodium chloride    . heparin 1,400 Units/hr (06/10/16 0412)    Labs:  Recent Labs  06/09/16 1533 06/10/16 0143  NA 136 133*  K 4.2 4.1  CL 101 99*  CO2 26 27  GLUCOSE 159* 257*  BUN 23* 29*  CREATININE 1.26* 1.36*  CALCIUM 9.7 9.1    Recent Labs  06/09/16  1533  AST 24  ALT 18  ALKPHOS 54  BILITOT 0.5  PROT 7.3  ALBUMIN 3.9    Recent Labs  06/09/16 1533 06/10/16 0143  WBC 9.6 11.4*  NEUTROABS 6.6*  --   HGB 15.9 14.5  HCT 45.8 42.5  MCV 83.0 84.6  PLT 245 210    Recent Labs  06/09/16 2208 06/09/16 2304 06/10/16 0143 06/10/16 0815  TROPONINI 0.42* 1.46* 3.27* 13.14*   Invalid input(s): POCBNP No results for input(s): HGBA1C in the last 72 hours.   Weights: Filed Weights   06/09/16 2252 06/10/16 0900 06/10/16 1200  Weight: 90.2 kg (198 lb 14.4 oz) 91.5 kg (201  lb 11.2 oz) 91.2 kg (201 lb)     Radiology/Studies:  Dg Chest 2 View  Result Date: 06/09/2016 CLINICAL DATA:  Onset of chest pain 3 hours prior to admission to the emergency department. Pain is decreased following nitroglycerin. EXAM: CHEST  2 VIEW COMPARISON:  None in PACs FINDINGS: The lungs are adequately inflated. There is no focal infiltrate. The heart and pulmonary vascularity are normal. The mediastinum is normal in width. There is calcification in the wall of the aortic arch. The bony thorax is unremarkable. IMPRESSION: There is no acute cardiopulmonary abnormality. Electronically Signed   By: David  [place] M.D.   On: 06/09/2016 16:32     Assessment and Recommendation  61 y.o. male with essential hypertension mixed hyperlipidemia diabetes with complication with the inferior non-ST elevation myocardial infarction neck sign 1. Proceed to PCI and stent placement of the right coronary artery 2. Antiplatelet medication management per above 3. Beta blocker ACE inhibitor for myocardial infarction 4. High intensity cholesterol therapy with atorvastatin 5. Patient to have cardiac rehabilitation which was discussed and recommended at this time  Signed, Arnoldo Hooker M.D. FACC

## 2016-06-10 NOTE — Progress Notes (Signed)
Patient remains stable post stent placement. Wife and family now present with procedure explained and questions answered. sr per monitor, no bleeding nor hematoma at right groin site. Denies complaints at this time. angiomax gtt to be discontinued at 1600, after which patient may go back to room

## 2016-06-10 NOTE — Progress Notes (Signed)
Patient ID: Alec Baker, male   DOB: [birth year], 61 y.o.   MRN: [id number]  [employer] PROGRESS NOTE  Alec HeinzRex Maillet [id number] DOB: [birth year] DOA: 06/09/2016 PCP: No PCP Per Patient  HPI/Subjective: Patient feeling okay this morning when I saw him. He did have shortness of breath and chest pain when he came in. Having a slight twinge in his chest and back this morning when I saw him.  Objective: Vitals:   06/10/16 1545 06/10/16 1600  BP:  114/74  Pulse:    Resp: 11 15  Temp:      Filed Weights   06/09/16 2252 06/10/16 0900 06/10/16 1200  Weight: 90.2 kg (198 lb 14.4 oz) 91.5 kg (201 lb 11.2 oz) 91.2 kg (201 lb)    ROS: Review of Systems  Constitutional: Negative for chills and fever.  Eyes: Negative for blurred vision.  Respiratory: Positive for shortness of breath. Negative for cough.   Cardiovascular: Positive for chest pain.  Gastrointestinal: Negative for abdominal pain, constipation, diarrhea, nausea and vomiting.  Genitourinary: Negative for dysuria.  Musculoskeletal: Negative for joint pain.  Neurological: Negative for dizziness and headaches.   Exam: Physical Exam  Constitutional: He is oriented to person, place, and time.  HENT:  Nose: No mucosal edema.  Mouth/Throat: No oropharyngeal exudate or posterior oropharyngeal edema.  Eyes: Conjunctivae, EOM and lids are normal. Pupils are equal, round, and reactive to light.  Neck: No JVD present. Carotid bruit is not present. No edema present. No thyroid mass and no thyromegaly present.  Cardiovascular: S1 normal and S2 normal.  Exam reveals no gallop.   No murmur heard. Pulses:      Dorsalis pedis pulses are 2+ on the right side, and 2+ on the left side.  Respiratory: No respiratory distress. He has decreased breath sounds in the right lower field and the left lower field. He has no wheezes. He has no rhonchi. He has no rales.  GI: Soft. Bowel sounds are normal. There is no tenderness.  Musculoskeletal:   Right ankle: He exhibits no swelling.       Left ankle: He exhibits no swelling.  Lymphadenopathy:    He has no cervical adenopathy.  Neurological: He is alert and oriented to person, place, and time. No cranial nerve deficit.  Skin: Skin is warm. No rash noted. Nails show no clubbing.  Psychiatric: He has a normal mood and affect.      Data Reviewed: Basic Metabolic Panel:  Recent Labs Lab 06/09/16 1533 06/10/16 0143  NA 136 133*  K 4.2 4.1  CL 101 99*  CO2 26 27  GLUCOSE 159* 257*  BUN 23* 29*  CREATININE 1.26* 1.36*  CALCIUM 9.7 9.1   Liver Function Tests:  Recent Labs Lab 06/09/16 1533  AST 24  ALT 18  ALKPHOS 54  BILITOT 0.5  PROT 7.3  ALBUMIN 3.9   CBC:  Recent Labs Lab 06/09/16 1533 06/10/16 0143  WBC 9.6 11.4*  NEUTROABS 6.6*  --   HGB 15.9 14.5  HCT 45.8 42.5  MCV 83.0 84.6  PLT 245 210   Cardiac Enzymes:  Recent Labs Lab 06/09/16 1909 06/09/16 2208 06/09/16 2304 06/10/16 0143 06/10/16 0815  TROPONINI 0.35* 0.42* 1.46* 3.27* 13.14*    CBG:  Recent Labs Lab 06/09/16 2304 06/10/16 0739 06/10/16 1047  GLUCAP 141* 170* 109*    Studies: Dg Chest 2 View  Result Date: 06/09/2016 CLINICAL DATA:  Onset of chest pain 3 hours prior to admission to the emergency department. Pain  is decreased following nitroglycerin. EXAM: CHEST  2 VIEW COMPARISON:  None in PACs FINDINGS: The lungs are adequately inflated. There is no focal infiltrate. The heart and pulmonary vascularity are normal. The mediastinum is normal in width. There is calcification in the wall of the aortic arch. The bony thorax is unremarkable. IMPRESSION: There is no acute cardiopulmonary abnormality. Electronically Signed   By: David  [place] M.D.   On: 06/09/2016 16:32    Scheduled Meds: . aspirin  81 mg Oral Daily  . aspirin EC  81 mg Oral Daily  . atorvastatin  40 mg Oral q1800  . docusate sodium  100 mg Oral BID  . guaiFENesin  600 mg Oral BID  . insulin aspart  0-20  Units Subcutaneous TID WC  . insulin aspart  6 Units Subcutaneous TID WC  . lisinopril  40 mg Oral Daily  . metFORMIN  1,000 mg Oral Daily  . metoprolol tartrate  25 mg Oral BID  . nicotine  21 mg Transdermal Daily  . nitroGLYCERIN  0.5 inch Topical Q6H  . sodium chloride flush  3 mL Intravenous Q12H  . sodium chloride flush  3 mL Intravenous Q12H  . sodium chloride flush  3 mL Intravenous Q12H  . ticagrelor  90 mg Oral BID  . tiotropium  18 mcg Inhalation Daily   Continuous Infusions: . sodium chloride    . heparin 1,400 Units/hr (06/10/16 0412)    Assessment/Plan:  1. NSTEMI, left bundle branch block. Patient was on heparin drip this morning when I saw him. Case discussed with Dr. Gwen Pounds for cardiac catheterization. Patient had a stent placement in the catheter lab. Still waiting for that report. Aspirin, Ticagrelor, metoprolol and lisinopril prescribed. 2. Acute kidney injury versus chronic kidney disease. Continue to monitor post catheterization. IV fluid hydration 3. Type 2 diabetes mellitus. Holding glyburide for procedure. And probably restart tomorrow. Hold metformin. 4. Essential hypertension continue metoprolol and lisinopril 5. COPD and tobacco abuse. Patient needs to stop smoking   Code Status:     Code Status Orders        Start     Ordered   06/09/16 2249  Full code  Continuous     06/09/16 2248    Code Status History    Date Active Date Inactive Code Status Order ID Comments User Context   This patient has a current code status but no historical code status.     Family Communication: Wife at the bedside Disposition Plan: potential disposition tomorrow depending on how he is feeling  Consultants:  Cardiology  Procedures:  Cardiac catheterization and stent  Time spent: 28 minutes  Alford Highland  [employer]

## 2016-06-10 NOTE — Progress Notes (Signed)
ANTICOAGULATION CONSULT NOTE - follow up  Pharmacy Consult for Heparin Indication: chest pain/ACS  No Known Allergies  Patient Measurements: Height: 5\' 7"  (170.2 cm) Weight: 201 lb 11.2 oz (91.5 kg) IBW/kg (Calculated) : 66.1 Heparin Dosing Weight: 84.9 kg  Vital Signs: BP: 102/65 (01/23 0449) Pulse Rate: 67 (01/23 0449)  Labs:  Recent Labs  06/09/16 1533 06/09/16 1909  06/09/16 2304 06/10/16 0143 06/10/16 0815 06/10/16 1018  HGB 15.9  --   --   --  14.5  --   --   HCT 45.8  --   --   --  42.5  --   --   PLT 245  --   --   --  210  --   --   APTT  --  73*  --   --   --   --   --   LABPROT  --  13.8  --   --   --   --   --   INR  --  1.06  --   --   --   --   --   HEPARINUNFRC  --   --   --   --  0.27*  --  0.55  CREATININE 1.26*  --   --   --  1.36*  --   --   TROPONINI 0.22* 0.35*  < > 1.46* 3.27* 13.14*  --   < > = values in this interval not displayed.  Estimated Creatinine Clearance: 63.1 mL/min (by C-G formula based on SCr of 1.36 mg/dL (H)).   Medical History: Past Medical History:  Diagnosis Date  . Diabetes mellitus without complication (HCC)   . Hypertension     Medications:  Prescriptions Prior to Admission  Medication Sig Dispense Refill Last Dose  . fenofibrate (TRICOR) 145 MG tablet Take 1 tablet by mouth daily.  0 06/09/2016 at 0500  . glyBURIDE (DIABETA) 2.5 MG tablet Take 1 tablet by mouth daily.  0 06/09/2016 at 0500  . hydrochlorothiazide (HYDRODIURIL) 25 MG tablet Take 1 tablet by mouth daily.  0 06/09/2016 at 0500  . ibuprofen (ADVIL,MOTRIN) 800 MG tablet Take 1 tablet by mouth 4 (four) times daily as needed.  3 Past Week at Unknown time  . lisinopril (PRINIVIL,ZESTRIL) 40 MG tablet Take 40 mg by mouth daily.  0 06/09/2016 at 0500  . metFORMIN (GLUCOPHAGE) 1000 MG tablet Take 1 tablet by mouth daily.  0 06/09/2016 at 0500    Assessment: 61 y/o M with a h/o DM and HTN admitted with CP. Current rate 1400 u/hr  Goal of Therapy:  Heparin level  0.3-0.7 units/ml Monitor platelets by anticoagulation protocol: Yes   Plan:  1/23 1015 anti-Xa level therapeutic x 1 at 0.55. Confirmed rate and that there were interruptions in drip or signs of bleeding with RN. Per cardio note, patient will be going to cath lab today. Will continue current rate and f/u plans after cath.  Horris LatinoHolly Gilliam, PharmD Pharmacy Resident 06/10/2016 10:59 AM

## 2016-06-10 NOTE — Progress Notes (Addendum)
Patient troponin increased from 0.42 to 1.46. MD Hugelmeyer made aware. No new orders at this time.    Update 0200: Troponin increase from 1.46 to 3.27. MD Pyreddy aware. No orders at this time. Will continue to monitor.   Mayra NeerNesbitt, Katrenia Alkins M

## 2016-06-10 NOTE — Progress Notes (Signed)
Pt post heart cath with rca stenosis,  Drug oluding stent placed . In recovery with vitals stable. sr per monitor, post ekg done per orders. Dr Juliann Paresallwood talking with patient,with questions answered. Right groin without bleeding nor hematoma at this time. Taking sips of coke without difficutly, denies complaints of chest pain at this time.  Have attempted to locate patient wife out to waiting room as well as called up to floor to have her sent down but not as yet. Ns infusing per orders as well as  angiomax gtt continues unitl 1600. Report called to Brett CanalesSteve RN with orders and plan reviewed.

## 2016-06-11 DIAGNOSIS — Z716 Tobacco abuse counseling: Secondary | ICD-10-CM

## 2016-06-11 DIAGNOSIS — E785 Hyperlipidemia, unspecified: Secondary | ICD-10-CM

## 2016-06-11 LAB — BASIC METABOLIC PANEL
Anion gap: 6 (ref 5–15)
BUN: 25 mg/dL — ABNORMAL HIGH (ref 6–20)
CALCIUM: 9.1 mg/dL (ref 8.9–10.3)
CO2: 28 mmol/L (ref 22–32)
CREATININE: 1.13 mg/dL (ref 0.61–1.24)
Chloride: 100 mmol/L — ABNORMAL LOW (ref 101–111)
GFR calc non Af Amer: >60 mL/min (ref >60)
Glucose, Bld: 86 mg/dL (ref 65–99)
Potassium: 3.9 mmol/L (ref 3.5–5.1)
Sodium: 134 mmol/L — ABNORMAL LOW (ref 135–145)

## 2016-06-11 LAB — GLUCOSE, CAPILLARY
GLUCOSE-CAPILLARY: 119 mg/dL — AB (ref 65–99)
Glucose-Capillary: 97 mg/dL (ref 65–99)

## 2016-06-11 LAB — LIPID PANEL
CHOL/HDL RATIO: 3.3 ratio
CHOLESTEROL: 121 mg/dL (ref 0–200)
HDL: 37 mg/dL — AB (ref >40)
LDL Cholesterol: 62 mg/dL (ref 0–99)
Triglycerides: 112 mg/dL (ref <150)
VLDL: 22 mg/dL (ref 0–40)

## 2016-06-11 LAB — HEMOGLOBIN A1C
Hgb A1c MFr Bld: 7.5 % — ABNORMAL HIGH (ref 4.8–5.6)
Mean Plasma Glucose: 169 mg/dL

## 2016-06-11 MED ORDER — METOPROLOL TARTRATE 25 MG PO TABS
25.0000 mg | ORAL_TABLET | Freq: Two times a day (BID) | ORAL | 6 refills | Status: AC
Start: 2016-06-11 — End: ?

## 2016-06-11 MED ORDER — ASPIRIN 81 MG PO CHEW: 81.0000 mg | CHEWABLE_TABLET | Freq: Every day | ORAL | 6 refills | Status: AC

## 2016-06-11 MED ORDER — ATORVASTATIN CALCIUM 40 MG PO TABS: 40.0000 mg | ORAL_TABLET | Freq: Every day | ORAL | 6 refills | Status: AC

## 2016-06-11 MED ORDER — NICOTINE 21 MG/24HR TD PT24: 21.0000 mg | MEDICATED_PATCH | Freq: Every day | TRANSDERMAL | 0 refills | Status: AC

## 2016-06-11 MED ORDER — GUAIFENESIN ER 600 MG PO TB12
600.0000 mg | ORAL_TABLET | Freq: Two times a day (BID) | ORAL | 0 refills | Status: AC
Start: 2016-06-11 — End: ?

## 2016-06-11 MED ORDER — TIOTROPIUM BROMIDE MONOHYDRATE 18 MCG IN CAPS
18.0000 ug | ORAL_CAPSULE | Freq: Every day | RESPIRATORY_TRACT | 12 refills | Status: AC
Start: 2016-06-12 — End: ?

## 2016-06-11 MED ORDER — TICAGRELOR 90 MG PO TABS: 90.0000 mg | ORAL_TABLET | Freq: Two times a day (BID) | ORAL | 6 refills | Status: AC

## 2016-06-11 MED ORDER — ALBUTEROL SULFATE HFA 108 (90 BASE) MCG/ACT IN AERS: 2.0000 | INHALATION_SPRAY | Freq: Four times a day (QID) | RESPIRATORY_TRACT | 2 refills | Status: AC | PRN

## 2016-06-11 NOTE — Progress Notes (Signed)
         To Whom It May Concern:          Mr. Alec Baker [facility] through 1.24.2014. He is to return to work at full capacity 2.1.2018 . Thank you for your understanding.          Sincerely,   Lenetta Quaker. Shirley Decamp, MD

## 2016-06-11 NOTE — Progress Notes (Signed)
[facility]  Patient: [place] / Admit Date: 06/09/2016 / Date of Encounter: 06/11/2016, 8:52 AM   Subjective: No further chest pain. Tolerating medications well. Beginning to ambulate at this point without evidence of significant new symptoms Cardiac catheterization shows moderate LV systolic dysfunction with inferior akinesis with ejection fraction of 35-40% Moderate atherosclerosis of left anterior descending and circumflex artery with totally occluded right coronary artery and proximal portion with collateral flow to distal PDA  Review of Systems: Positive for: Shortness breath chest pain Negative for: Vision change, hearing change, syncope, dizziness, nausea, vomiting,diarrhea, bloody stool, stomach pain, cough, congestion, diaphoresis, urinary frequency, urinary pain,skin lesions, skin rashes Others previously listed  Objective: Telemetry: Normal sinus rhythm Physical Exam: Blood pressure (!) 103/58, pulse 67, temperature 97.6 F (36.4 C), temperature source Oral, resp. rate 16, height 5\' 7"  (1.702 m), weight 91.7 kg (202 lb 3.2 oz), SpO2 96 %. Body mass index is 31.67 kg/m. General: Well developed, well nourished, in no acute distress. Head: Normocephalic, atraumatic, sclera non-icteric, no xanthomas, nares are without discharge. Neck: No apparent masses Lungs: Normal respirations with no wheezes, no rhonchi, no rales , no crackles   Heart: Regular rate and rhythm, normal S1 S2, no murmur, no rub, no gallop, PMI is normal size and placement, carotid upstroke normal without bruit, jugular venous pressure normal Abdomen: Soft, non-tender, non-distended with normoactive bowel sounds. No hepatosplenomegaly. Abdominal aorta is normal size without bruit Extremities: No edema, no clubbing, no cyanosis, no ulcers,  Peripheral: 2+ radial, 2+ femoral, 2+ dorsal pedal pulses Neuro: Alert and oriented. Moves all extremities spontaneously. Psych:  Responds  to questions appropriately with a normal affect.   Intake/Output Summary (Last 24 hours) at 06/11/16 0852 Last data filed at 06/11/16 0521  Gross per 24 hour  Intake              740 ml  Output                0 ml  Net              740 ml    Inpatient Medications:  . aspirin  81 mg Oral Daily  . atorvastatin  40 mg Oral q1800  . docusate sodium  100 mg Oral BID  . guaiFENesin  600 mg Oral BID  . insulin aspart  0-20 Units Subcutaneous TID WC  . insulin aspart  6 Units Subcutaneous TID WC  . lisinopril  40 mg Oral Daily  . metoprolol tartrate  25 mg Oral BID  . nicotine  21 mg Transdermal Daily  . nitroGLYCERIN  0.5 inch Topical Q6H  . sodium chloride flush  3 mL Intravenous Q12H  . sodium chloride flush  3 mL Intravenous Q12H  . sodium chloride flush  3 mL Intravenous Q12H  . ticagrelor  90 mg Oral BID  . tiotropium  18 mcg Inhalation Daily   Infusions:    Labs:  Recent Labs  06/10/16 0143 06/11/16 0419  NA 133* 134*  K 4.1 3.9  CL 99* 100*  CO2 27 28  GLUCOSE 257* 86  BUN 29* 25*  CREATININE 1.36* 1.13  CALCIUM 9.1 9.1    Recent Labs  06/09/16 1533  AST 24  ALT 18  ALKPHOS 54  BILITOT 0.5  PROT 7.3  ALBUMIN 3.9    Recent Labs  06/09/16 1533 06/10/16 0143  WBC 9.6 11.4*  NEUTROABS 6.6*  --   HGB 15.9 14.5  HCT 45.8 42.5  MCV 83.0 84.6  PLT 245 210    Recent Labs  06/10/16 0143 06/10/16 0815 06/10/16 1623 06/10/16 2222  TROPONINI 3.27* 13.14* >65.00* >65.00*   Invalid input(s): POCBNP  Recent Labs  06/10/16 0143  HGBA1C 7.5*     Weights: Filed Weights   06/10/16 0900 06/10/16 1200 06/11/16 0508  Weight: 91.5 kg (201 lb 11.2 oz) 91.2 kg (201 lb) 91.7 kg (202 lb 3.2 oz)     Radiology/Studies:  Dg Chest 2 View  Result Date: 06/09/2016 CLINICAL DATA:  Onset of chest pain 3 hours prior to admission to the emergency department. Pain is decreased following nitroglycerin. EXAM: CHEST  2 VIEW COMPARISON:  None in PACs FINDINGS: The  lungs are adequately inflated. There is no focal infiltrate. The heart and pulmonary vascularity are normal. The mediastinum is normal in width. There is calcification in the wall of the aortic arch. The bony thorax is unremarkable. IMPRESSION: There is no acute cardiopulmonary abnormality. Electronically Signed   By: David  [place] M.D.   On: 06/09/2016 16:32     Assessment and Recommendation  61 y.o. male with essential hypertension mixed hyperlipidemia diabetes with complication with the inferior non-ST elevation myocardial infarction neck sig  1. Successful PCI and stent placement of the right coronary artery yesterday 2. Antiplatelet medication management for further risk reduction of in-stent thrombosis 3. Beta blocker ACE inhibitor for myocardial infarction 4. High intensity cholesterol therapy with atorvastatin 5. Patient to have cardiac rehabilitation which was discussed and recommended at this time 6. Okay for discharge home from cardiac standpoint if ambulating well with follow-up next week  Signed, Arnoldo Hooker M.D. FACC

## 2016-06-11 NOTE — Care Management (Signed)
Patient has pharmacy coverage through his commercial insurance.  discharging on Brilinta.  Provided with free trial and copay assist coupon

## 2016-06-11 NOTE — Discharge Summary (Signed)
[facility] [place] [facility]   PATIENT NAME: Alec Baker    MR#:  [id number]  DATE OF BIRTH:  [birth year]  DATE OF ADMISSION:  06/09/2016 ADMITTING PHYSICIAN: Katharina Caper, MD  DATE OF DISCHARGE: 06/11/2016  2:59 PM  PRIMARY CARE PHYSICIAN: No PCP Per Patient     ADMISSION DIAGNOSIS:  NSTEMI (non-ST elevated myocardial infarction) (HCC) [I21.4] Chest pain [R07.9]  DISCHARGE DIAGNOSIS:  Principal Problem:   Acute non Q wave MI (myocardial infarction), initial episode of care [facility]) Active Problems:   Acute renal insufficiency   Diabetes mellitus (HCC)   Essential hypertension   Obesity   Obstructive sleep apnea   Hyperlipidemia   Tobacco abuse counseling   Left bundle branch block   SECONDARY DIAGNOSIS:   Past Medical History:  Diagnosis Date  . Diabetes mellitus without complication (HCC)   . Hypertension     .pro HOSPITAL COURSE:   The patient has 61 year old Caucasian male with past medical history significant for history of diabetes, hypertension, obesity, suspected obstructive sleep apnea, who presents to the hospital with complaints of severe chest pain in the sternal area, feeling like a hammer hitting the chest. Patient admitted of some nausea but not vomiting. Also, chills, no other symptoms. No alleviating or aggravating factors. No radiation. On arrival to emergency room, he was noted to have elevated troponin. He was initiated on IV heparin and admitted to the hospital. His troponin rise to about 65. Patient went into cardiac catheterization lab 06/10/2016 by Dr. Gwen Pounds. Cardiac catheterization revealed moderate left ventricular systolic dysfunction with inferior akinesis with ejection fraction of 35-40%, moderate atherosclerosis of left anterior descending and circumflex artery was totally occluded right coronary artery and proximal portion of collateral flow to distal PDA, stent was placed to right coronary artery. Postoperatively,  patient did well, he was felt to be stable to be discharged home. Discussion by problem: #1. Non-STEMI, left bundle branch block, patient was managed on heparin, nitroglycerin, metoprolol, underwent cardiac catheterization and had PCI and stent placement to RCA, he is to continue aspirin, carvedilol, metoprolol, lisinopril, follow up with primary care physician and set her cardiologist as outpatient #2. 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 Acute kidney injury, improved with IV hydration, follow with ACE inhibitor #4. Diabetes mellitus type 2, resume outpatient medications #5. Essential hypertension, continue metoprolol and lisinopril #6. COPD and tobacco abuse, discussed this patient for 4 minutes, nicotine replacement therapy was initiated, he was agreeable to continue it #7 suspected obstructive sleep apnea, patient may benefit from sleep study as outpatient    DISCHARGE CONDITIONS:   Stable  CONSULTS OBTAINED:  Treatment Team:  Lamar Blinks, MD  DRUG ALLERGIES:  No Known Allergies  DISCHARGE MEDICATIONS:   Discharge Medication List as of 06/11/2016  1:53 PM    START taking these medications   Details  albuterol (PROVENTIL HFA;VENTOLIN HFA) 108 (90 Base) MCG/ACT inhaler Inhale 2 puffs into the lungs every 6 (six) hours as needed for wheezing or shortness of breath., Starting Wed 06/11/2016, Print    aspirin 81 MG chewable tablet Chew 1 tablet (81 mg total) by mouth daily., Starting Thu 06/12/2016, Print    atorvastatin (LIPITOR) 40 MG tablet Take 1 tablet (40 mg total) by mouth daily at 6 PM., Starting Wed 06/11/2016, Print    guaiFENesin (MUCINEX) 600 MG 12 hr tablet Take 1 tablet (600 mg total) by mouth  2 (two) times daily., Starting Wed 06/11/2016, Print    metoprolol tartrate (LOPRESSOR) 25 MG tablet Take 1 tablet (25 mg total) by mouth 2 (two) times daily., Starting Wed  06/11/2016, Print    nicotine (NICODERM CQ - DOSED IN MG/24 HOURS) 21 mg/24hr patch Place 1 patch (21 mg total) onto the skin daily., Starting Thu 06/12/2016, Print    ticagrelor (BRILINTA) 90 MG TABS tablet Take 1 tablet (90 mg total) by mouth 2 (two) times daily., Starting Wed 06/11/2016, Print    tiotropium (SPIRIVA) 18 MCG inhalation capsule Place 1 capsule (18 mcg total) into inhaler and inhale daily., Starting Thu 06/12/2016, Print      CONTINUE these medications which have NOT CHANGED   Details  fenofibrate (TRICOR) 145 MG tablet Take 1 tablet by mouth daily., Starting Wed 06/04/2016, Historical Med    glyBURIDE (DIABETA) 2.5 MG tablet Take 1 tablet by mouth daily., Starting Wed 06/04/2016, Historical Med    lisinopril (PRINIVIL,ZESTRIL) 40 MG tablet Take 40 mg by mouth daily., Starting Tue 06/03/2016, Historical Med    metFORMIN (GLUCOPHAGE) 1000 MG tablet Take 1 tablet by mouth daily., Starting Wed 06/04/2016, Historical Med      STOP taking these medications     hydrochlorothiazide (HYDRODIURIL) 25 MG tablet      ibuprofen (ADVIL,MOTRIN) 800 MG tablet          DISCHARGE INSTRUCTIONS:    The patient is to follow-up with primary care physician within one week after discharge  If you experience worsening of your admission symptoms, develop shortness of breath, life threatening emergency, suicidal or homicidal thoughts you must seek medical attention immediately by calling 911 or calling your MD immediately  if symptoms less severe.  You Must read complete instructions/literature along with all the possible adverse reactions/side effects for all the Medicines you take and that have been prescribed to you. Take any new Medicines after you have completely understood and accept all the possible adverse reactions/side effects.   Please note  You were cared for by a hospitalist during your hospital stay. If you have any questions about your discharge medications or the care you  received while you were in the hospital after you are discharged, you can call the unit and asked to speak with the hospitalist on call if the hospitalist that took care of you is not available. Once you are discharged, your primary care physician will handle any further medical issues. Please note that NO REFILLS for any discharge medications will be authorized once you are discharged, as it is imperative that you return to your primary care physician (or establish a relationship with a primary care physician if you do not have one) for your aftercare needs so that they can reassess your need for medications and monitor your lab values.    Today   CHIEF COMPLAINT:   Chief Complaint  Patient presents with  . Chest Pain    HISTORY OF PRESENT ILLNESS:  Crews Sciandra  is a 61 y.o. male with a known history of diabetes, hypertension, obesity, suspected obstructive sleep apnea, who presents to the hospital with complaints of severe chest pain in the sternal area, feeling like a hammer hitting the chest. Patient admitted of some nausea but not vomiting. Also, chills, no other symptoms. No alleviating or aggravating factors. No radiation. On arrival to emergency room, he was noted to have elevated troponin. He was initiated on IV heparin and admitted to the hospital. His troponin rise to about 65. Patient  went into cardiac catheterization lab 06/10/2016 by Dr. Gwen PoundsKowalski. Cardiac catheterization revealed moderate left ventricular systolic dysfunction with inferior akinesis with ejection fraction of 35-40%, moderate atherosclerosis of left anterior descending and circumflex artery was totally occluded right coronary artery and proximal portion of collateral flow to distal PDA, stent was placed to right coronary artery. Postoperatively, patient did well, he was felt to be stable to be discharged home. Discussion by problem: #1. Non-STEMI, left bundle branch block, patient was managed on heparin, nitroglycerin,  metoprolol, underwent cardiac catheterization and had PCI and stent placement to RCA, he is to continue aspirin, carvedilol, metoprolol, lisinopril, follow up with primary care physician and set her cardiologist as outpatient #2. 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 Acute kidney injury, improved with IV hydration, follow with ACE inhibitor #4. Diabetes mellitus type 2, resume outpatient medications #5. Essential hypertension, continue metoprolol and lisinopril #6. COPD and tobacco abuse, discussed this patient for 4 minutes, nicotine replacement therapy was initiated, he was agreeable to continue it #7 suspected obstructive sleep apnea, patient may benefit from sleep study as outpatient    VITAL SIGNS:  Blood pressure 114/71, pulse 66, temperature 98.2 F (36.8 C), temperature source Oral, resp. rate 19, height 5\' 7"  (1.702 m), weight 91.7 kg (202 lb 3.2 oz), SpO2 96 %.  I/O:   Intake/Output Summary (Last 24 hours) at 06/11/16 1740 Last data filed at 06/11/16 1358  Gross per 24 hour  Intake             1220 ml  Output                0 ml  Net             1220 ml    PHYSICAL EXAMINATION:  GENERAL:  61 y.o.-year-old patient lying in the bed with no acute distress.  EYES: Pupils equal, round, reactive to light and accommodation. No scleral icterus. Extraocular muscles intact.  HEENT: Head atraumatic, normocephalic. Oropharynx and nasopharynx clear.  NECK:  Supple, no jugular venous distention. No thyroid enlargement, no tenderness.  LUNGS: Normal breath sounds bilaterally, no wheezing, rales,rhonchi or crepitation. No use of accessory muscles of respiration.  CARDIOVASCULAR: S1, S2 normal. No murmurs, rubs, or gallops.  ABDOMEN: Soft, non-tender, non-distended. Bowel sounds present. No organomegaly or mass.  EXTREMITIES: No pedal edema, cyanosis, or clubbing.  NEUROLOGIC: Cranial nerves  II through XII are intact. Muscle strength 5/5 in all extremities. Sensation intact. Gait not checked.  PSYCHIATRIC: The patient is alert and oriented x 3.  SKIN: No obvious rash, lesion, or ulcer.   DATA REVIEW:   CBC  Recent Labs Lab 06/10/16 0143  WBC 11.4*  HGB 14.5  HCT 42.5  PLT 210    Chemistries   Recent Labs Lab 06/09/16 1533  06/11/16 0419  NA 136  < > 134*  K 4.2  < > 3.9  CL 101  < > 100*  CO2 26  < > 28  GLUCOSE 159*  < > 86  BUN 23*  < > 25*  CREATININE 1.26*  < > 1.13  CALCIUM 9.7  < > 9.1  AST 24  --   --   ALT 18  --   --   ALKPHOS 54  --   --   BILITOT 0.5  --   --   < > = values in this interval not displayed.  Cardiac Enzymes  Recent Labs Lab 06/10/16 2222  TROPONINI >65.00*    Microbiology Results  No results found for this or any previous visit.  RADIOLOGY:  No results found.  EKG:   Orders placed or performed during the hospital encounter of 06/09/16  . ED EKG  . ED EKG  . EKG 12-Lead immediately post procedure  . EKG 12-Lead  . EKG 12-Lead immediately post procedure  . EKG 12-Lead  . EKG 12-Lead  . EKG 12-Lead      Management plans discussed with the patient, family and they are in agreement.  CODE STATUS:     Code Status Orders        Start     Ordered   06/09/16 2249  Full code  Continuous     06/09/16 2248    Code Status History    Date Active Date Inactive Code Status Order ID Comments User Context   This patient has a current code status but no historical code status.      TOTAL TIME TAKING CARE OF THIS PATIENT: 40  minutes.  Discussed this patient's wife, all questions were answered  Jaleen Finch M.D on 06/11/2016 at 5:40 PM  Between 7am to 6pm - Pager - [phone]  After 6pm go to [email or web address] - password [email or web address] [facility]  [facility]  Office  [phone]  CC: Primary care physician; No PCP Per Patient

## 2016-06-11 NOTE — Plan of Care (Signed)
Problem: Pain Managment: Goal: General experience of comfort will improve Outcome: Progressing Patient has been chest pain free

## 2016-06-12 ENCOUNTER — Encounter: Payer: Self-pay | Admitting: Internal Medicine

## 2016-06-12 LAB — POCT ACTIVATED CLOTTING TIME: Activated Clotting Time: 301 seconds

## 2018-07-04 IMAGING — CR DG CHEST 2V
2 series · 3 of 3 positions shown · non-contrast
Comparison: None in PACs

CLINICAL DATA: Onset of chest pain 3 hours prior to admission to
the emergency department. Pain is decreased following nitroglycerin.

EXAM:
CHEST  2 VIEW

[Series 1: chest pa · 0.14mm/px · 2 of 2 slices shown]
[im 1/2]
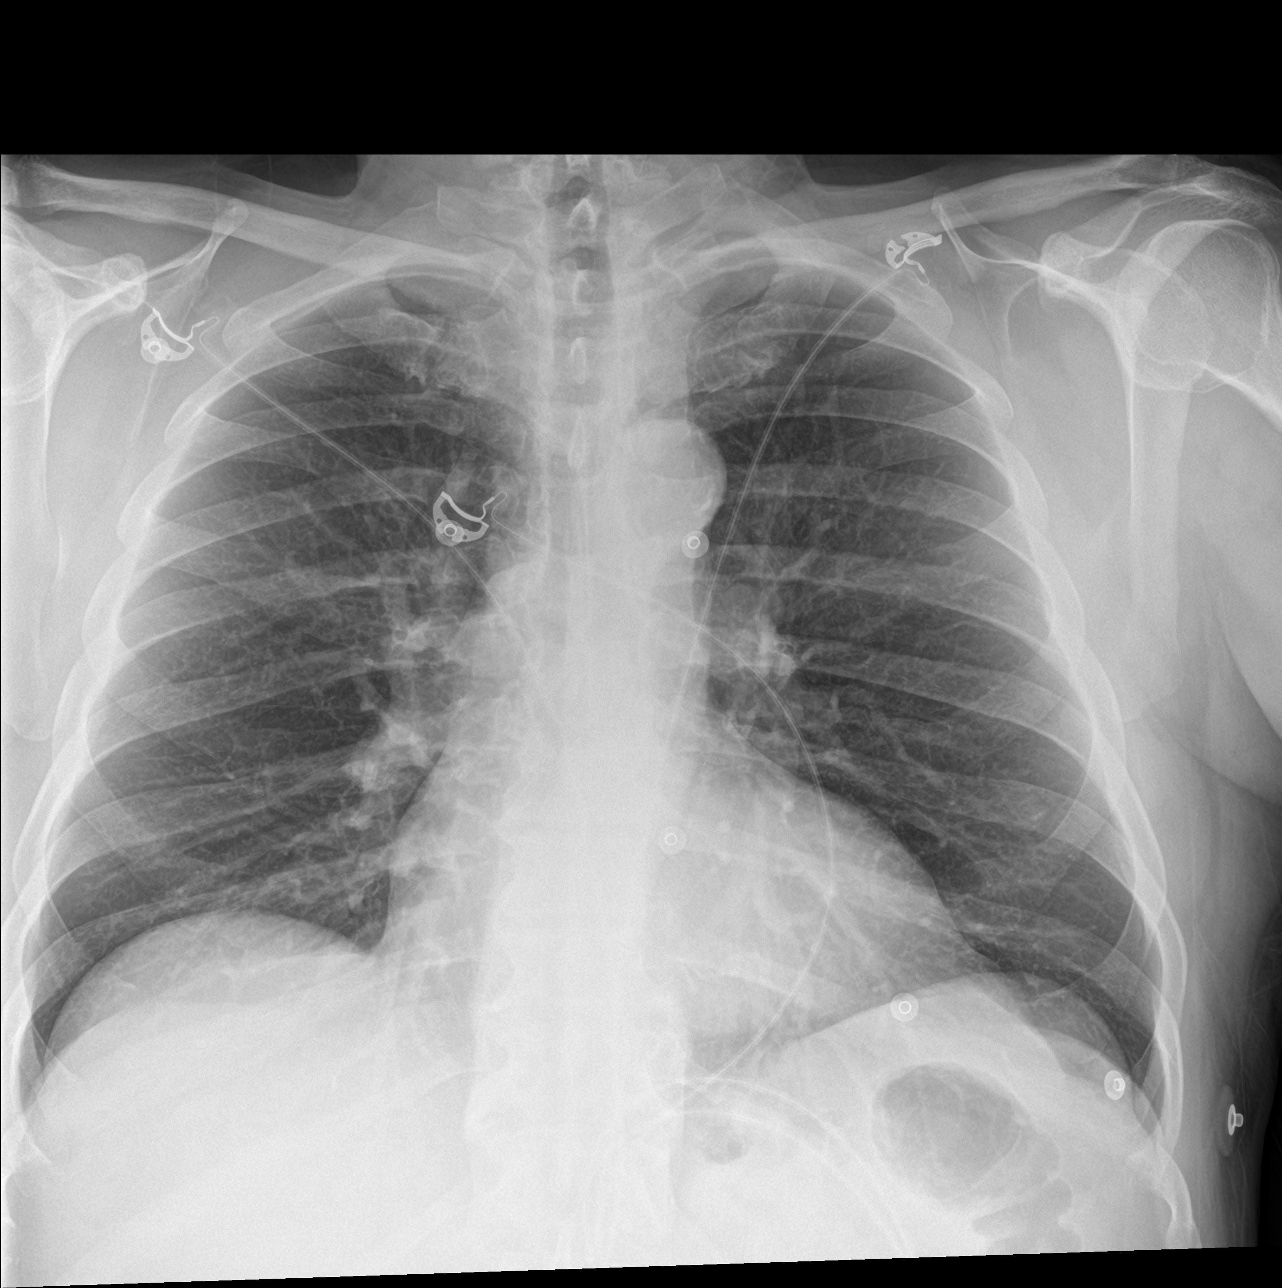
[im 2/2]
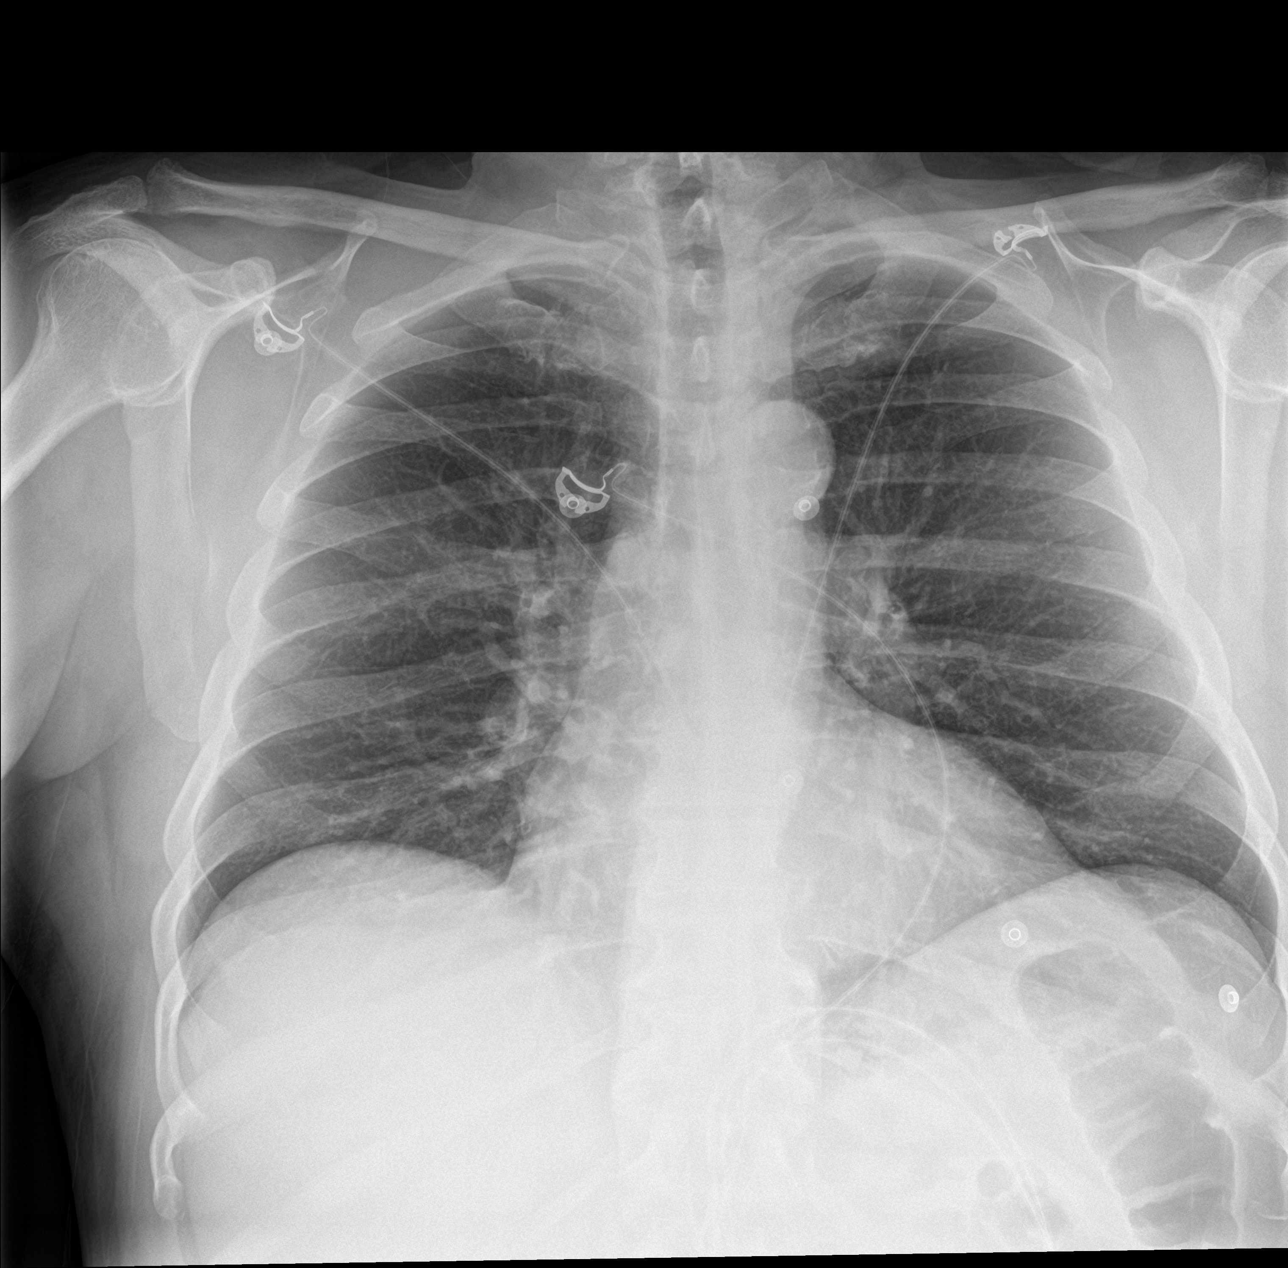

[chest lat]
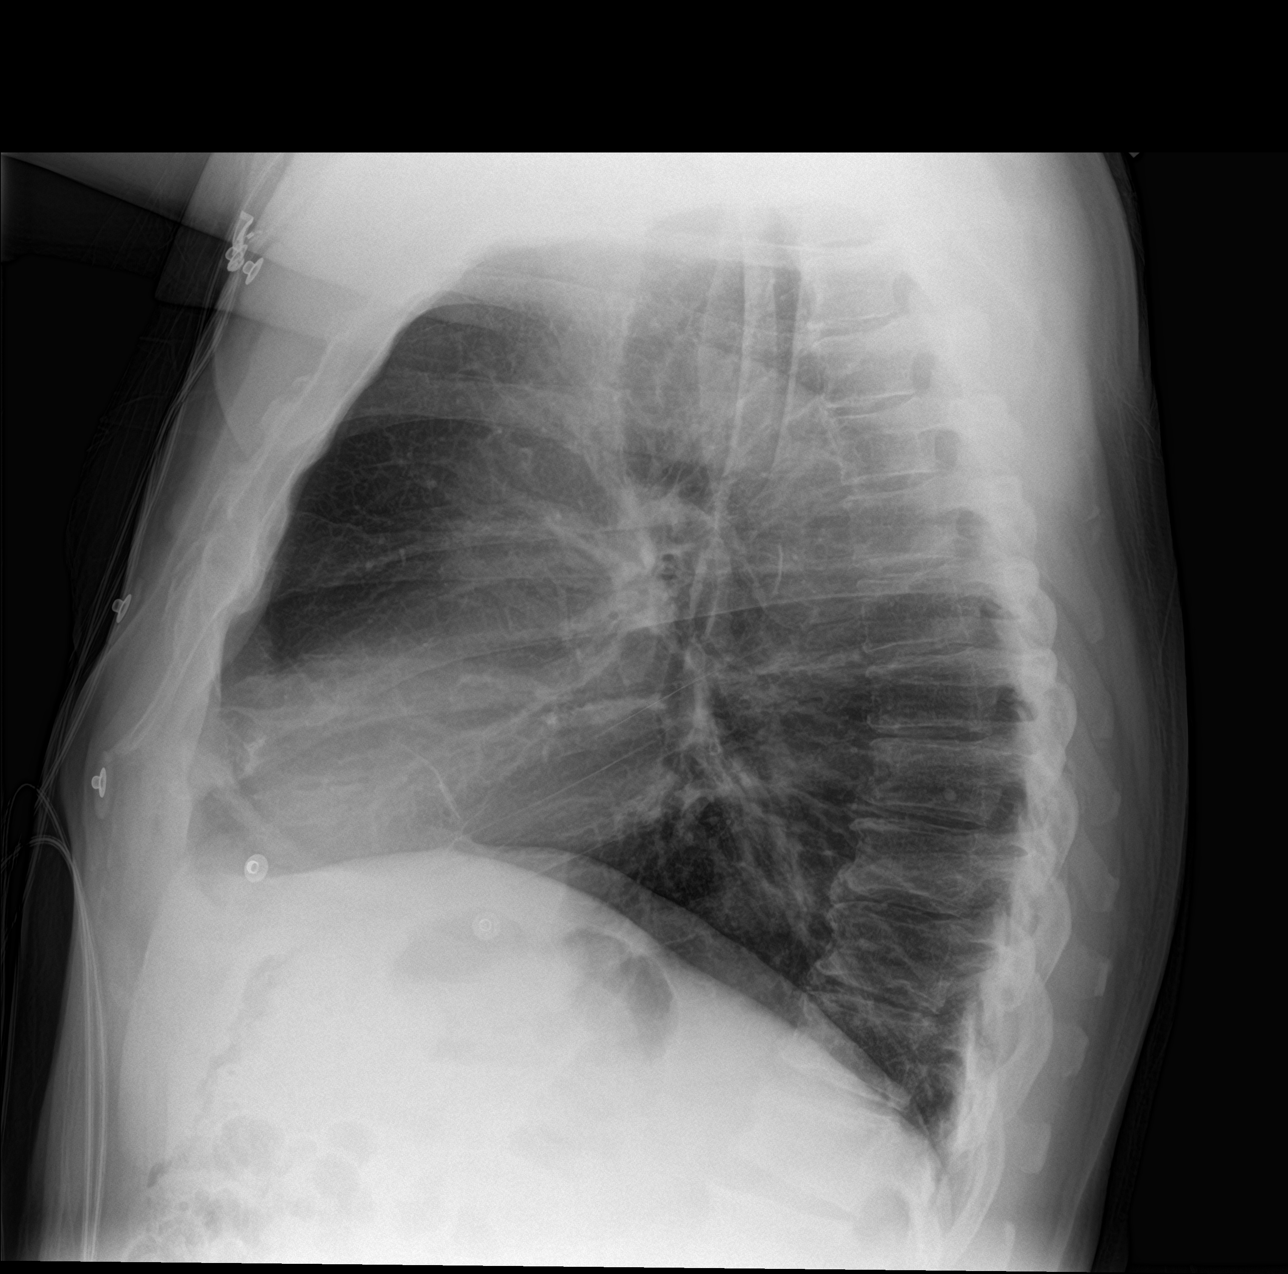

[3 of 3 positions shown; findings below may reference images not displayed]

FINDINGS: The lungs are adequately inflated. There is no focal infiltrate. The
heart and pulmonary vascularity are normal. The mediastinum is
normal in width. There is calcification in the wall of the aortic
arch. The bony thorax is unremarkable.
IMPRESSION: There is no acute cardiopulmonary abnormality.
